# Patient Record
Sex: Female | Born: 2000 | Race: White | Hispanic: No | Marital: Single | Smoking: Never smoker
Health system: Southern US, Community
[De-identification: ages and names within clinical notes are randomized; demographics above are authoritative.]

## PROBLEM LIST (undated history)

## (undated) DIAGNOSIS — H9319 Tinnitus, unspecified ear: Secondary | ICD-10-CM

## (undated) DIAGNOSIS — R569 Unspecified convulsions: Secondary | ICD-10-CM

## (undated) DIAGNOSIS — R531 Weakness: Secondary | ICD-10-CM

## (undated) DIAGNOSIS — R519 Headache, unspecified: Secondary | ICD-10-CM

## (undated) DIAGNOSIS — F429 Obsessive-compulsive disorder, unspecified: Secondary | ICD-10-CM

## (undated) DIAGNOSIS — N390 Urinary tract infection, site not specified: Secondary | ICD-10-CM

## (undated) DIAGNOSIS — R5383 Other fatigue: Secondary | ICD-10-CM

## (undated) DIAGNOSIS — R55 Syncope and collapse: Secondary | ICD-10-CM

## (undated) DIAGNOSIS — R51 Headache: Secondary | ICD-10-CM

## (undated) DIAGNOSIS — L700 Acne vulgaris: Secondary | ICD-10-CM

## (undated) HISTORY — DX: Urinary tract infection, site not specified: N39.0

## (undated) HISTORY — DX: Headache, unspecified: R51.9

## (undated) HISTORY — DX: Acne vulgaris: L70.0

## (undated) HISTORY — DX: Weakness: R53.1

## (undated) HISTORY — PX: WISDOM TOOTH EXTRACTION: SHX21

## (undated) HISTORY — DX: Tinnitus, unspecified ear: H93.19

## (undated) HISTORY — DX: Unspecified convulsions: R56.9

## (undated) HISTORY — DX: Syncope and collapse: R55

## (undated) HISTORY — DX: Headache: R51

## (undated) HISTORY — DX: Other fatigue: R53.83

## (undated) HISTORY — DX: Obsessive-compulsive disorder, unspecified: F42.9

---
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
## 2018-01-04 ENCOUNTER — Encounter: Payer: Self-pay | Admitting: Internal Medicine

## 2018-01-04 ENCOUNTER — Ambulatory Visit: Payer: Commercial Managed Care - PPO | Admitting: Internal Medicine

## 2018-01-04 DIAGNOSIS — G47411 Narcolepsy with cataplexy: Secondary | ICD-10-CM

## 2018-01-04 MED ORDER — AMPHETAMINE-DEXTROAMPHET ER 30 MG PO CP24: 30.0000 mg | ORAL_CAPSULE | Freq: Every day | ORAL | 0 refills | Status: DC

## 2018-01-04 MED ORDER — AMPHETAMINE-DEXTROAMPHETAMINE 10 MG PO TABS: ORAL_TABLET | ORAL | 0 refills | Status: DC

## 2018-01-04 NOTE — Patient Instructions (Signed)
We have increased the Adderall XR to 30 mg, once daily  We have given Adderall 10 mg to take one or two daily as extra support, if needed.  Please call if I can help  Good luck with that college letter!

## 2018-01-04 NOTE — Progress Notes (Signed)
HPI female never smoker followed for Narcolepsy with cataplexy NPSG 10/18/17-AHI 0/hour, desaturation to 96%, REM latency 48 minutes, 26.49% REM, total sleep time 443.5 minutes.  Body weight 140 pounds. MSLT 10/19/17-mean sleep latency 2: 19 minutes, SOREMS 2 / 5 naps Consistent with Narcolepsy  -------------------------------------------------------------------------------- [birth year]/30/17-17 year old female never smoker for sleep evaluation. NPSG 10/18/17-AHI 0/hour, desaturation to 96%, REM latency 48 minutes, 26.49% REM, total sleep time 443.5 minutes.  Body weight 140 pounds. MSLT 10/19/17-mean sleep latency 2: 19 minutes, SOREMS 2 / 5 naps Consistent with Narcolepsy -------sleep consult, referred by Angela BurkeLynn Clett, PA- always tired, falls asleep easily, fast, and during inappropriate times Here with mother, who has watched sleep. Onset of oppressive sleepiness began after mono age 17. No others similar in family. Sleeps soundly, but remains sleepy through the day. Naps necessary but not much help. Coffee has little effect. Vivid dreams. Sleep paralysis 2-3 times per month, lasting 1-2 minutes. Cataplexy occasionally- weak knees with laughter. No snoring or parasomnias otherwise. Vivid dreams even with cat-naps.  Now a HS senior, headed to college. Good over-all health. No suggestion of head trauma, seizure, systemic illness. Epworth 6021  12/2469/3319-17 year old female never smoker followed for Narcolepsy with cataplexy Narcolepsy with cataplexy: Pt is wanting to know if she needs to increase dose of Adderrall as she feels that her body is getting use to it. Headaches come back mid-afternoon with tiredness.  We started Adderall XR 25 mg once daily as of 11/30/17 visit. Adderall XR 25 mg is not working as well-possibly developing some tolerance.  Still some daytime sleepiness.  She is being careful with driving, schoolwork and sleep habits, napping when she can.   ROS-see HPI   + = positive Constitutional:     weight loss, night sweats, fevers, chills, +fatigue, lassitude. HEENT:    headaches, difficulty swallowing, tooth/dental problems, sore throat,       sneezing, itching, ear ache, nasal congestion, post nasal drip, snoring CV:    chest pain, orthopnea, PND, swelling in lower extremities, anasarca,                                                      dizziness, palpitations Resp:   shortness of breath with exertion or at rest.                productive cough,   non-productive cough, coughing up of blood.              change in color of mucus.  wheezing.   Skin:    rash or lesions. GI:  No-   heartburn, indigestion, abdominal pain, nausea, vomiting, diarrhea,                 change in bowel habits, loss of appetite GU: dysuria, change in color of urine, no urgency or frequency.   flank pain. MS:   joint pain, stiffness, decreased range of motion, back pain. Neuro-     nothing unusual Psych:  change in mood or affect.  depression or anxiety.   memory loss.  OBJ- Physical Exam General- Alert, Oriented, Affect-appropriate, Distress- none acute, fully awake at this visit Skin- rash-none, lesions- none, excoriation- none Lymphadenopathy- none Head- atraumatic            Eyes- Gross vision intact, PERRLA, conjunctivae and secretions clear  Ears- Hearing, canals-normal            Nose- Clear, no-Septal dev, mucus, polyps, erosion, perforation             Throat- Mallampati II , mucosa clear , drainage- none, tonsils- atrophic Neck- flexible , trachea midline, no stridor , thyroid nl, carotid no bruit Chest - symmetrical excursion , unlabored           Heart/CV- RRR , no murmur , no gallop  , no rub, nl s1 s2                           - JVD- none , edema- none, stasis changes- none, varices- none           Lung- clear to P&A, wheeze- none, cough- none , dullness-none, rub- none           Chest wall-  Abd-  Br/ Gen/ Rectal- Not done, not indicated Extrem- cyanosis- none, clubbing,  none, atrophy- none, strength- nl Neuro- grossly intact to observation

## 2018-01-26 ENCOUNTER — Telehealth: Payer: Self-pay | Admitting: Pediatrics

## 2018-01-26 ENCOUNTER — Ambulatory Visit: Payer: Commercial Managed Care - PPO | Admitting: Pediatrics

## 2018-01-26 ENCOUNTER — Encounter: Payer: Self-pay | Admitting: Pediatrics

## 2018-01-26 VITALS — Wt 135.9 lb

## 2018-01-26 DIAGNOSIS — R1031 Right lower quadrant pain: Secondary | ICD-10-CM | POA: Diagnosis not present

## 2018-01-26 LAB — CBC WITH DIFFERENTIAL/PLATELET
Basophils Absolute: 33 cells/uL (ref 0–200)
Basophils Relative: 0.5 %
EOS PCT: 0.6 %
Eosinophils Absolute: 39 cells/uL (ref 15–500)
HEMATOCRIT: 37.5 % (ref 34.0–46.0)
HEMOGLOBIN: 12.9 g/dL (ref 11.5–15.3)
LYMPHS ABS: 2308 {cells}/uL (ref 1200–5200)
MCH: 30.4 pg (ref 25.0–35.0)
MCHC: 34.4 g/dL (ref 31.0–36.0)
MCV: 88.2 fL (ref 78.0–98.0)
MONOS PCT: 5.4 %
MPV: 10 fL (ref 7.5–12.5)
NEUTROS ABS: 3770 {cells}/uL (ref 1800–8000)
Neutrophils Relative %: 58 %
Platelets: 279 10*3/uL (ref 140–400)
RBC: 4.25 10*6/uL (ref 3.80–5.10)
RDW: 12.2 % (ref 11.0–15.0)
Total Lymphocyte: 35.5 %
WBC mixed population: 351 cells/uL (ref 200–900)
WBC: 6.5 10*3/uL (ref 4.5–13.0)

## 2018-01-26 LAB — COMPLETE METABOLIC PANEL WITH GFR
AG Ratio: 1.9 (calc) (ref 1.0–2.5)
ALBUMIN MSPROF: 4.7 g/dL (ref 3.6–5.1)
ALT: 12 U/L (ref 5–32)
AST: 14 U/L (ref 12–32)
Alkaline phosphatase (APISO): 65 U/L (ref 47–176)
BUN: 16 mg/dL (ref 7–20)
CALCIUM: 9.8 mg/dL (ref 8.9–10.4)
CO2: 26 mmol/L (ref 20–32)
CREATININE: 0.97 mg/dL (ref 0.50–1.00)
Chloride: 105 mmol/L (ref 98–110)
GFR, EST NON AFRICAN AMERICAN: 85 mL/min/{1.73_m2} (ref >=60)
GFR, Est African American: 99 mL/min/{1.73_m2} (ref >=60)
GLUCOSE: 81 mg/dL (ref 65–99)
Globulin: 2.5 g/dL (calc) (ref 2.0–3.8)
Potassium: 4.3 mmol/L (ref 3.8–5.1)
Sodium: 139 mmol/L (ref 135–146)
Total Bilirubin: 0.7 mg/dL (ref 0.2–1.1)
Total Protein: 7.2 g/dL (ref 6.3–8.2)

## 2018-01-26 LAB — C-REACTIVE PROTEIN: CRP: 0.2 mg/L (ref <8.0)

## 2018-01-26 NOTE — Telephone Encounter (Signed)
Discussed lab and ultra sound results with mom. Lab work results were normal. Ultrasound showed no ovary or bowel abnormality. Instructed mom to have Savannah Hanna return to office for follow up either tomorrow morning or Thursday afternoon. Mom verbalized understanding and agreement.

## 2018-01-26 NOTE — Progress Notes (Signed)
Subjective:     Savannah Hanna is a 17 y.o. female who presents for evaluation of abdominal pain. Approximately 1 week ago, Malavika developed pressure in her right lower abdominal area that self resolved. Last night, the pain returned and was worse. She describes the pain as being stabbing and dull. She felt "puffy" and bloated in that area as well. She became nauseated last night and has had no relief since then. She states that this morning, she couldn't bend over without pain. She denies vomiting, diarrhea, constipation, fevers.   The patient's history has been marked as reviewed and updated as appropriate.  Review of Systems Pertinent items are noted in HPI.     Objective:    Wt 135 lb 14.4 oz (61.6 kg)   BMI 24.07 kg/m  General appearance: alert, cooperative, appears stated age and no distress Head: Normocephalic, without obvious abnormality, atraumatic Eyes: conjunctivae/corneas clear. PERRL, EOM's intact. Fundi benign. Ears: normal TM's and external ear canals both ears Nose: Nares normal. Septum midline. Mucosa normal. No drainage or sinus tenderness. Throat: lips, mucosa, and tongue normal; teeth and gums normal Neck: no adenopathy, no carotid bruit, no JVD, supple, symmetrical, trachea midline and thyroid not enlarged, symmetric, no tenderness/mass/nodules Lungs: clear to auscultation bilaterally Heart: regular rate and rhythm, S1, S2 normal, no murmur, click, rub or gallop Abdomen: normal findings: bowel sounds normal, symmetric and soft and abnormal findings:  tender in RLQ without rebound tenderness, heel strike test negative    Assessment:   Right lower quadrant abdominal pain   Plan:    The diagnosis was discussed with the patient and evaluation and treatment plans outlined. See orders for lab and imaging studies. Reassured patient that symptoms are almost certainly benign and self-resolving. Further follow-up plans will be based on outcome of lab/imaging  studies; see orders. Follow up in 2 days or as needed.   CBC WNL, CRP, not elevated RLQ US normal

## 2018-01-27 ENCOUNTER — Telehealth: Payer: Self-pay | Admitting: Pediatrics

## 2018-01-27 NOTE — Telephone Encounter (Signed)
Per mom, Savannah Hanna pain has improved a little since yesterday. Recommended take a stool softener. Appointment made for tomorrow afternoon for follow up. Mom verbalized understanding and agreement.

## 2018-01-28 ENCOUNTER — Encounter: Payer: Self-pay | Admitting: Pediatrics

## 2018-01-28 ENCOUNTER — Ambulatory Visit: Payer: Commercial Managed Care - PPO | Admitting: Pediatrics

## 2018-01-28 DIAGNOSIS — Z09 Encounter for follow-up examination after completed treatment for conditions other than malignant neoplasm: Secondary | ICD-10-CM | POA: Diagnosis not present

## 2018-01-28 NOTE — Patient Instructions (Signed)
Stool softeners as needed to help with hard stool Follow up as needed

## 2018-01-28 NOTE — Progress Notes (Signed)
Savannah Hanna was seen 2 days ago with right lower quadrant pain. Her US was negative. She reports today that she feels less bloated and the pain has resolved. She does complain of headaches, stating that they occur about the same time of day daily. She recently started stimulant medications for narcolepsy. When she takes her medication, the headaches resolved. No vomiting with headache though she admits to photophobia during the headache.     Review of Systems  Constitutional:  Negative for  appetite change.  HENT:  Negative for nasal and ear discharge.   Eyes: Negative for discharge, redness and itching.  Respiratory:  Negative for cough and wheezing.   Cardiovascular: Negative.  Gastrointestinal: Negative for vomiting and diarrhea.  Musculoskeletal: Negative for arthralgias.  Skin: Negative for rash.  Neurological: Negative       Objective:   Physical Exam  Constitutional: Appears well-developed and well-nourished.   HENT:  Ears: Both TM's normal Nose: No nasal discharge.  Mouth/Throat: Mucous membranes are moist. .  Eyes: Pupils are equal, round, and reactive to light.  Neck: Normal range of motion..  Cardiovascular: Regular rhythm.  No murmur heard. Pulmonary/Chest: Effort normal and breath sounds normal. No wheezes with  no retractions.  Abdominal: Soft. Bowel sounds are normal. No distension and no tenderness.  Musculoskeletal: Normal range of motion.  Neurological: Active and alert.  Skin: Skin is warm and moist. No rash noted.       Assessment:      Follow up RLQ abdominal pain- resolved   Plan:   Follow up with sleep specialists ZO:[id number]:headaches  Follow as needed

## 2018-02-01 ENCOUNTER — Encounter: Payer: Self-pay | Admitting: Pediatrics

## 2018-02-01 ENCOUNTER — Ambulatory Visit: Payer: Commercial Managed Care - PPO | Admitting: Internal Medicine

## 2018-02-01 ENCOUNTER — Encounter: Payer: Self-pay | Admitting: Internal Medicine

## 2018-02-01 DIAGNOSIS — G47411 Narcolepsy with cataplexy: Secondary | ICD-10-CM | POA: Diagnosis not present

## 2018-02-01 MED ORDER — METHYLPHENIDATE HCL ER 20 MG PO TBCR: EXTENDED_RELEASE_TABLET | ORAL | 0 refills | Status: DC

## 2018-02-01 NOTE — Progress Notes (Signed)
[birth year]/30/[birth year]-17 year old female never smoker followed for narcolepsy with cataplexy NPSG 10/18/17-AHI 0/hour, desaturation to 96%, REM latency 48 minutes, 26.49% REM, total sleep time 443.5 minutes.  Body weight 140 pounds. MSLT 10/19/17-mean sleep latency 2: 19 minutes, SOREMS 2 / 5 naps Consistent with Narcolepsy --------------------------------------------------------  [birth year]-17 year old female never smoker followed for Narcolepsy with cataplexy Narcolepsy with cataplexy: Pt is wanting to know if she needs to increase dose of Adderrall as she feels that her body is getting use to it. Headaches come back mid-afternoon with tiredness.  We started Adderall XR 25 mg once daily as of 11/30/17 visit.  [birth year]18-17 year old female never smoker followed for Narcolepsy with cataplexy ----Narcolepsy with cataplexy-pt states Adderall has helped but has noticed no full effect and has headaches. Headaches happen at same time of day every day (3:15pm).  Adderall XR 30 mg twice daily, Adderall 10 mg twice daily if needed Pounding headache with photophobia suggestive of migraine around end of school day.  This began when we increased Adderall to 30 mg Exar.  Additional 10 mg tabs were not much help.  Also noticed some mood irritability at higher doses.  ROS-see HPI   + = positive Constitutional:    weight loss, night sweats, fevers, chills, +fatigue, lassitude. HEENT:    +headaches, difficulty swallowing, tooth/dental problems, sore throat,       sneezing, itching, ear ache, nasal congestion, post nasal drip, snoring CV:    chest pain, orthopnea, PND, swelling in lower extremities, anasarca,                                                      dizziness, palpitations Resp:   shortness of breath with exertion or at rest.                productive cough,   non-productive cough, coughing up of blood.              change in color of mucus.  wheezing.   Skin:    rash or lesions. GI:  No-   heartburn, indigestion,  abdominal pain, nausea, vomiting, diarrhea,                 change in bowel habits, loss of appetite GU: dysuria, change in color of urine, no urgency or frequency.   flank pain. MS:   joint pain, stiffness, decreased range of motion, back pain. Neuro-     nothing unusual Psych:  change in mood or affect.  depression or anxiety.   memory loss.  OBJ- Physical Exam General- Alert, Oriented, Affect-appropriate, Distress- none acute Skin- rash-none, lesions- none, excoriation- none Lymphadenopathy- none Head- atraumatic            Eyes- Gross vision intact, PERRLA, conjunctivae and secretions clear            Ears- Hearing, canals-normal            Nose- Clear, no-Septal dev, mucus, polyps, erosion, perforation             Throat- Mallampati II , mucosa clear , drainage- none, tonsils- atrophic Neck- flexible , trachea midline, no stridor , thyroid nl, carotid no bruit Chest - symmetrical excursion , unlabored           Heart/CV- RRR , no murmur , no gallop  , no rub, nl s1 s2                           -  JVD- none , edema- none, stasis changes- none, varices- none           Lung- clear to P&A, wheeze- none, cough- none , dullness-none, rub- none           Chest wall-  Abd-  Br/ Gen/ Rectal- Not done, not indicated Extrem- cyanosis- none, clubbing, none, atrophy- none, strength- nl Neuro- grossly intact to observation

## 2018-02-01 NOTE — Patient Instructions (Signed)
Change Adderall XR to methylphenidate 20 mg ER    1 twice daily if needed  Please cal as needed

## 2018-02-06 NOTE — Assessment & Plan Note (Signed)
Adderall XR 25 mg not quite enough, but 30 mg XR associated with irritability and headache.  As we seek best options, discussion and education were continued appropriately.  Mother participated. Plan-change to methylphenidate 20 mg ER twice daily if needed

## 2018-02-08 ENCOUNTER — Telehealth: Payer: Self-pay | Admitting: Pediatrics

## 2018-02-08 ENCOUNTER — Ambulatory Visit
Admission: RE | Admit: 2018-02-08 | Discharge: 2018-02-08 | Disposition: A | Discharge status: Home / Self Care | Payer: Commercial Managed Care - PPO | Source: Ambulatory Visit | Attending: Pediatrics | Admitting: Pediatrics

## 2018-02-08 ENCOUNTER — Other Ambulatory Visit: Payer: Self-pay | Admitting: Pediatrics

## 2018-02-08 DIAGNOSIS — R05 Cough: Secondary | ICD-10-CM

## 2018-02-08 DIAGNOSIS — R059 Cough, unspecified: Secondary | ICD-10-CM

## 2018-02-08 NOTE — Telephone Encounter (Signed)
Per our conversation for a chest xray for Savannah Hanna I put the report on your desk thanks

## 2018-02-08 NOTE — Progress Notes (Signed)
[facility] H[facility]. On exam, per the RN, she had diminished lung sounds on the right side. Chest xray ordered.

## 2018-02-08 NOTE — Telephone Encounter (Signed)
[facility] diminished lung sounds on the right. Will call mom with results.

## 2018-02-08 NOTE — Telephone Encounter (Signed)
Chest xray negative for PNA. Discussed results with mom. Mom verbalized understanding.

## 2018-02-23 ENCOUNTER — Encounter: Payer: Self-pay | Admitting: Pediatrics

## 2018-02-23 ENCOUNTER — Ambulatory Visit: Payer: Commercial Managed Care - PPO | Admitting: Pediatrics

## 2018-02-23 VITALS — Temp 98.3°F | Wt 136.5 lb

## 2018-02-23 DIAGNOSIS — R509 Fever, unspecified: Secondary | ICD-10-CM

## 2018-02-23 DIAGNOSIS — J101 Influenza due to other identified influenza virus with other respiratory manifestations: Secondary | ICD-10-CM

## 2018-02-23 LAB — POCT INFLUENZA B: RAPID INFLUENZA B AGN: NEGATIVE

## 2018-02-23 LAB — POCT RAPID STREP A (OFFICE): Rapid Strep A Screen: NEGATIVE

## 2018-02-23 LAB — POCT INFLUENZA A: RAPID INFLUENZA A AGN: POSITIVE

## 2018-02-23 MED ORDER — OSELTAMIVIR PHOSPHATE 75 MG PO CAPS: 75.0000 mg | ORAL_CAPSULE | Freq: Two times a day (BID) | ORAL | 0 refills | Status: AC

## 2018-02-23 NOTE — Assessment & Plan Note (Addendum)
As expected, we are going to continue adjusting stimulant medication.  The end of school year is approaching, with college and away from home issues next year which we have discussed. Plan- Increase maintenance Adderall to 30 XR and allow 10 mg once or twice daily as needed. Consider modafanil.

## 2018-02-23 NOTE — Progress Notes (Signed)
Subjective:     Savannah Hanna is a 17 y.o. female who presents for evaluation of influenza like symptoms. Symptoms include chills, headache, myalgias, productive cough, sinus and nasal congestion, sore throat and fever and have been present for 1 day. She has tried to alleviate the symptoms with acetaminophen and ibuprofen with moderate relief. High risk factors for influenza complications: none.  The following portions of the patient's history were reviewed and updated as appropriate: allergies, current medications, past family history, past medical history, past social history, past surgical history and problem list.  Review of Systems Pertinent items are noted in HPI.     Objective:    Temp 98.3 F (36.8 C)   Wt 136 lb 8 oz (61.9 kg)   BMI 23.43 kg/m  General appearance: alert, cooperative, appears stated age and no distress Head: Normocephalic, without obvious abnormality, atraumatic Eyes: conjunctivae/corneas clear. PERRL, EOM's intact. Fundi benign. Ears: normal TM's and external ear canals both ears Nose: Nares normal. Septum midline. Mucosa normal. No drainage or sinus tenderness., moderate congestion Throat: lips, mucosa, and tongue normal; teeth and gums normal Neck: no adenopathy, no carotid bruit, no JVD, supple, symmetrical, trachea midline and thyroid not enlarged, symmetric, no tenderness/mass/nodules Lungs: clear to auscultation bilaterally Heart: regular rate and rhythm, S1, S2 normal, no murmur, click, rub or gallop    Assessment:    Influenza    Plan:    Supportive care with appropriate antipyretics and fluids. Educational material distributed and questions answered. Antivirals per orders. Follow up in 4 days or as needed.   Discussed potential side effects of Tamiflu Rapid strep negative, throat culture pending. Will call patient if culture results positive. Patient and parent aware.

## 2018-02-23 NOTE — Patient Instructions (Addendum)
1 capsul Tamiflu, two times a day for 5 days Ibuprofen every 6 hours, Tylenol every 4 hours as needed for fevers/pain Encourage plenty of fluids Rest! Rapid strep negative, throat culture pending- no news is good news  Influenza, Pediatric Influenza, more commonly known as "the flu," is a viral infection that primarily affects your child's respiratory tract. The respiratory tract includes organs that help your child breathe, such as the lungs, nose, and throat. The flu causes many common cold symptoms, as well as a high fever and body aches. The flu spreads easily from person to person (is contagious). Having your child get a flu shot (influenza vaccination) every year is the best way to prevent influenza. What are the causes? Influenza is caused by a virus. Your child can catch the virus by:  Breathing in droplets from an infected person's cough or sneeze.  Touching something that was recently contaminated with the virus and then touching his or her mouth, nose, or eyes.  What increases the risk? Your child may be more likely to get the flu if he or she:  Does not clean his or her hands frequently with soap and water or alcohol-based hand sanitizer.  Has close contact with many people during cold and flu season.  Touches his or her mouth, eyes, or nose without washing or sanitizing his or her hands first.  Does not drink enough fluids or does not eat a healthy diet.  Does not get enough sleep or exercise.  Is under a high amount of stress.  Does not get a yearly (annual) flu shot.  Your child may be at a higher risk of complications from the flu, such as a severe lung infection (pneumonia), if he or she:  Has a weakened disease-fighting system (immune system). Your child may have a weakened immune system if he or she: ? Has HIV or AIDS. ? Is undergoing chemotherapy. ? Is taking medicines that reduce the activity of (suppress) the immune system.  Has a long-term (chronic)  illness, such as heart disease, kidney disease, diabetes, or lung disease.  Has a liver disorder.  Has anemia.  What are the signs or symptoms? Symptoms of this condition typically last 4-10 days. Symptoms can vary depending on your child's age, and they may include:  Fever.  Chills.  Headache, body aches, or muscle aches.  Sore throat.  Cough.  Runny or congested nose.  Chest discomfort and cough.  Poor appetite.  Weakness or tiredness (fatigue).  Dizziness.  Nausea or vomiting.  How is this diagnosed? This condition may be diagnosed based on your child's medical history and a physical exam. Your child's health care provider may do a nose or throat swab test to confirm the diagnosis. How is this treated? If influenza is detected early, your child can be treated with antiviral medicine. Antiviral medicine can reduce the length of your child's illness and the severity of his or her symptoms. This medicine may be given by mouth (orally) or through an IV tube that is inserted in one of your child's veins. The goal of treatment is to relieve your child's symptoms by taking care of your child at home. This may include having your child take over-the-counter medicines and drink plenty of fluids. Adding humidity to the air in your home may also help to relieve your child's symptoms. In some cases, influenza goes away on its own. Severe influenza or complications from influenza may be treated in a hospital. Follow these instructions at home: Medicines  Give your child over-the-counter and prescription medicines only as told by your child's health care provider.  Do not give your child aspirin because of the association with Reye syndrome. General instructions   Use a cool mist humidifier to add humidity to the air in your child's room. This can make it easier for your child to breathe.  Have your child: ? Rest as needed. ? Drink enough fluid to keep his or her urine clear or  pale yellow. ? Cover his or her mouth and nose when coughing or sneezing. ? Wash his or her hands with soap and water often, especially after coughing or sneezing. If soap and water are not available, have your child use hand sanitizer. You should wash or sanitize your hands often as well.  Keep your child home from work, school, or daycare as told by your child's health care provider. Unless your child is visiting a health care provider, it is best to keep your child home until his or her fever has been gone for 24 hours after without the use of medicine.  Clear mucus from your young child's nose, if needed, by gentle suction with a bulb syringe.  Keep all follow-up visits as told by your child's health care provider. This is important. How is this prevented?  Having your child get an annual flu shot is the best way to prevent your child from getting the flu. ? An annual flu shot is recommended for every child who is 6 months or older. Different shots are available for different age groups. ? Your child may get the flu shot in late summer, fall, or winter. If your child needs two doses of the vaccine, it is best to get the first shot done as early as possible. Ask your child's health care provider when your child should get the flu shot.  Have your child wash his or her hands often or use hand sanitizer often if soap and water are not available.  Have your child avoid contact with people who are sick during cold and flu season.  Make sure your child is eating a healthy diet, getting plenty of rest, drinking plenty of fluids, and exercising regularly. Contact a health care provider if:  Your child develops new symptoms.  Your child has: ? Ear pain. In young children and babies, this may cause crying and waking at night. ? Chest pain. ? Diarrhea. ? A fever.  Your child's cough gets worse.  Your child produces more mucus.  Your child feels nauseous.  Your child vomits. Get help  right away if:  Your child develops difficulty breathing or starts breathing quickly.  Your child's skin or nails turn blue or purple.  Your child is not drinking enough fluids.  Your child will not wake up or interact with you.  Your child develops a sudden headache.  Your child cannot stop vomiting.  Your child has severe pain or stiffness in his or her neck.  Your child who is younger than 3 months has a temperature of 100F (38C) or higher. This information is not intended to replace advice given to you by your health care provider. Make sure you discuss any questions you have with your health care provider. Document Released: 12/01/2005 Document Revised: 05/08/2016 Document Reviewed: 09/25/2015 Elsevier Interactive Patient Education  2017 [employer].

## 2018-02-25 LAB — CULTURE, GROUP A STREP
MICRO NUMBER:: 90313197
SPECIMEN QUALITY:: ADEQUATE

## 2018-03-01 ENCOUNTER — Ambulatory Visit: Payer: Commercial Managed Care - PPO | Admitting: Internal Medicine

## 2018-03-01 ENCOUNTER — Encounter: Payer: Self-pay | Admitting: Internal Medicine

## 2018-03-01 DIAGNOSIS — G47411 Narcolepsy with cataplexy: Secondary | ICD-10-CM

## 2018-03-01 MED ORDER — ARMODAFINIL 150 MG PO TABS: 150.0000 mg | ORAL_TABLET | Freq: Every day | ORAL | 5 refills | Status: DC

## 2018-03-01 NOTE — Progress Notes (Signed)
[birth year]17 year old female never smoker followed for narcolepsy with cataplexy NPSG 10/18/17-AHI 0/hour, desaturation to 96%, REM latency 48 minutes, 26.49% REM, total sleep time 443.5 minutes.  Body weight 140 pounds. MSLT 10/19/17-mean sleep latency 2: 19 minutes, SOREMS 2 / 5 naps Consistent with Narcolepsy --------------------------------------------------------  02/01/18-17 year old female never smoker followed for Narcolepsy with cataplexy ----Narcolepsy with cataplexy-pt states Adderall has helped but has noticed no full effect and has headaches. Headaches happen at same time of day every day (3:15pm).  Adderall XR 30 mg twice daily, Adderall 10 mg twice daily if needed Pounding headache with photophobia suggestive of migraine around end of school day.  This began when we increased Adderall to 30 mg XR.  Additional 10 mg tabs were not much help.  Also noticed some mood irritability at higher doses.  03/01/18- 17 year old female never smoker followed for Narcolepsy with cataplexy We had started with Adderall XR 25 mg, gone up to 30 mgXR with 10 mg supplements, then backed off because of headache and photophobia to try methylphenidate 20 mg ER twice daily if needed. Here with mother.  Doing pretty would well now with methylphenidate 20 mg ER once or twice daily.  Ask occasional short naps as needed.  School seems to be doing okay and she is in competitive dances. We discussed possible autoimmune connection with narcolepsy-family history of Graves' disease, scleroderma, fibromyalgia. Discussed a trial of armodafinil for comparison.  Exams are coming up and she will start school again in mid August. Her onset of symptoms was after mono in eighth grade and has had more frequent ENT infections since then.  ROS-see HPI   + = positive Constitutional:    weight loss, night sweats, fevers, chills, +fatigue, lassitude. HEENT:    +headaches, difficulty swallowing, tooth/dental problems, sore throat,    sneezing, itching, ear ache, nasal congestion, post nasal drip, snoring CV:    chest pain, orthopnea, PND, swelling in lower extremities, anasarca,                                                      dizziness, palpitations Resp:   shortness of breath with exertion or at rest.                productive cough,   non-productive cough, coughing up of blood.              change in color of mucus.  wheezing.   Skin:    rash or lesions. GI:  No-   heartburn, indigestion, abdominal pain, nausea, vomiting, diarrhea,                 change in bowel habits, loss of appetite GU: dysuria, change in color of urine, no urgency or frequency.   flank pain. MS:   joint pain, stiffness, decreased range of motion, back pain. Neuro-     nothing unusual Psych:  change in mood or affect.  depression or anxiety.   memory loss.  OBJ- Physical Exam General- Alert, Oriented, Affect-appropriate, Distress- none acute Skin- rash-none, lesions- none, excoriation- none Lymphadenopathy- none Head- atraumatic            Eyes- Gross vision intact, PERRLA, conjunctivae and secretions clear            Ears- Hearing, canals-normal  Nose- Clear, no-Septal dev, mucus, polyps, erosion, perforation             Throat- Mallampati II , mucosa clear , drainage- none, tonsils- atrophic Neck- flexible , trachea midline, no stridor , thyroid nl, carotid no bruit Chest - symmetrical excursion , unlabored           Heart/CV- RRR , no murmur , no gallop  , no rub, nl s1 s2                           - JVD- none , edema- none, stasis changes- none, varices- none           Lung- clear to P&A, wheeze- none, cough- none , dullness-none, rub- none           Chest wall-  Abd-  Br/ Gen/ Rectal- Not done, not indicated Extrem- cyanosis- none, clubbing, none, atrophy- none, strength- nl Neuro- grossly intact to observation

## 2018-03-01 NOTE — Patient Instructions (Signed)
Script printed to try armodafinil (Nuvigil) once daily in the morning instead of methylphenidate.  Please call as needed

## 2018-03-02 NOTE — Assessment & Plan Note (Signed)
She will continue methylphenidate as discussed but can try a prescription for armodafinil as an alternative for comparison.

## 2018-03-03 ENCOUNTER — Telehealth: Payer: Self-pay

## 2018-03-03 NOTE — Telephone Encounter (Signed)
PA for Armodafinil 150mg  has been started via CMM.  KEY: [id number]  Will route to Katie to f/u on.

## 2018-03-04 NOTE — Telephone Encounter (Signed)
Checked CMM for update  Approved on March 20  Request Reference Number: [id number]. ARMODAFINIL TAB 150MG  is approved through 03/04/2019. For further questions, call [phone].  DrugArmodafinil 150MG  OR TABS  FormOptumRx Electronic Prior Authorization Form  Original Claim Info75 PA required, call OptumRxat [phone] Requires Prior Authorization  Called Pharmacy and they are aware of approval  Spoke with  Patient Mother about approval, she stated that she was in class at this time and would let her know

## 2018-03-04 NOTE — Telephone Encounter (Signed)
[employer] [facility] has approved Armodafinil 150mg  Tablet Rx from 03-03-18 through 03-03-18. I have contact [facility] and gave approval information to them-was told they refilled Rx and patient has picked up medication so patient is aware of approval as well.  PA# [id number]

## 2018-04-15 ENCOUNTER — Telehealth: Payer: Self-pay | Admitting: Internal Medicine

## 2018-04-15 MED ORDER — METHYLPHENIDATE HCL ER 20 MG PO TBCR: EXTENDED_RELEASE_TABLET | ORAL | 0 refills | Status: DC

## 2018-04-15 NOTE — Telephone Encounter (Signed)
RX has been printed and placed on CY's cart.   Will call mother before placing RX in the mail.

## 2018-04-15 NOTE — Telephone Encounter (Signed)
Spoke with pt's mother Savannah Hanna (dpr on file), requesting refill on metadate .  Pt's mother verified that she had tried Nuvigil  as prescribed at last OV, but pt states that Metadate works better for her.   Last refill: 02/01/18 #60 with 0 refills, 1 tab PO BID prn( (last Nuvigil refill: 03/01/18 #30 with 5 refills- 1 po qd)  CY please advise if ok to prescribe Metadate.  Pt's mother would like rx sent to verified home address on file.  Thanks!

## 2018-04-15 NOTE — Telephone Encounter (Signed)
Ok to remove [employer] as before

## 2018-06-28 ENCOUNTER — Encounter: Payer: Self-pay | Admitting: Internal Medicine

## 2018-06-28 ENCOUNTER — Ambulatory Visit: Payer: Commercial Managed Care - PPO | Admitting: Internal Medicine

## 2018-06-28 DIAGNOSIS — G47411 Narcolepsy with cataplexy: Secondary | ICD-10-CM | POA: Diagnosis not present

## 2018-06-28 MED ORDER — METHYLPHENIDATE HCL ER 20 MG PO TBCR: EXTENDED_RELEASE_TABLET | ORAL | 0 refills | Status: DC

## 2018-06-28 NOTE — Assessment & Plan Note (Signed)
She is doing very well with naps and stable use of metadate  She understands responsibility to drive safely and to protect her metadate supply at school.

## 2018-06-28 NOTE — Patient Instructions (Signed)
Script printed for [employer] refill  Please let us know where you will need Escript refill sent.  Please call as needed

## 2018-06-28 NOTE — Progress Notes (Signed)
[birth year]17 year old female never smoker followed for narcolepsy with cataplexy NPSG 10/18/17-AHI 0/hour, desaturation to 96%, REM latency 48 minutes, 26.49% REM, total sleep time 443.5 minutes.  Body weight 140 pounds. MSLT 10/19/17-mean sleep latency 2: 19 minutes, SOREMS 2 / 5 naps Consistent with Narcolepsy -------------------------------------------------------- 03/01/18- 17 year old female never smoker followed for Narcolepsy with cataplexy We had started with Adderall XR 25 mg, gone up to 30 mgXR with 10 mg supplements, then backed off because of headache and photophobia to try methylphenidate 20 mg ER twice daily if needed. Here with mother.  Doing pretty would well now with methylphenidate 20 mg ER once or twice daily.  Ask occasional short naps as needed.  School seems to be doing okay and she is in competitive dances. We discussed possible autoimmune connection with narcolepsy-family history of Graves' disease, scleroderma, fibromyalgia. Discussed a trial of armodafinil for comparison.  Exams are coming up and she will start school again in mid August. Her onset of symptoms was after mono in eighth grade and has had more frequent ENT infections since then.  06/28/2018- 17 year old female never smoker followed for Narcolepsy with cataplexy ----Narcolepsy with cataplexy: Pt states metadate 20 ER is working well for her. Pt is set to attend  [employer] August 2019-will need to discuss getting medication at student Health.  She preferred Metadate over Nuvigil, which caused headaches.  She uses 1- 1.5 tabs daily if needed.  Feels better if she takes it.  Denies palpitation or overstimulation.  Occasional headache not different.  She will be starting at [employer] in mid August.  Student health there apparently can handle E prescription for continuation of monthly prescriptions of Metadate.                  ROS-see HPI   + = positive Constitutional:    weight loss, night sweats, fevers, chills,  +fatigue, lassitude. HEENT:    +headaches, difficulty swallowing, tooth/dental problems, sore throat,       sneezing, itching, ear ache, nasal congestion, post nasal drip, snoring CV:    chest pain, orthopnea, PND, swelling in lower extremities, anasarca,                                                   dizziness, palpitations Resp:   shortness of breath with exertion or at rest.                productive cough,   non-productive cough, coughing up of blood.              change in color of mucus.  wheezing.   Skin:    rash or lesions. GI:  No-   heartburn, indigestion, abdominal pain, nausea, vomiting, diarrhea,                 change in bowel habits, loss of appetite GU: dysuria, change in color of urine, no urgency or frequency.   flank pain. MS:   joint pain, stiffness, decreased range of motion, back pain. Neuro-     nothing unusual Psych:  change in mood or affect.  depression or anxiety.   memory loss.  OBJ- Physical Exam General- Alert, Oriented, Affect-appropriate, Distress- none acute, +calm and alert Skin- rash-none, lesions- none, excoriation- none Lymphadenopathy- none Head- atraumatic  Eyes- Gross vision intact, PERRLA, conjunctivae and secretions clear            Ears- Hearing, canals-normal            Nose- Clear, no-Septal dev, mucus, polyps, erosion, perforation             Throat- Mallampati II , mucosa clear , drainage- none, tonsils- atrophic Neck- flexible , trachea midline, no stridor , thyroid nl, carotid no bruit Chest - symmetrical excursion , unlabored           Heart/CV- RRR , no murmur , no gallop  , no rub, nl s1 s2                           - JVD- none , edema- none, stasis changes- none, varices- none           Lung- clear to P&A, wheeze- none, cough- none , dullness-none, rub- none           Chest wall-  Abd-  Br/ Gen/ Rectal- Not done, not indicated Extrem- cyanosis- none, clubbing, none, atrophy- none, strength- nl Neuro- grossly intact to  observation, no tremor

## 2018-08-30 ENCOUNTER — Telehealth: Payer: Self-pay | Admitting: Internal Medicine

## 2018-08-30 DIAGNOSIS — G47411 Narcolepsy with cataplexy: Secondary | ICD-10-CM

## 2018-08-30 NOTE — Telephone Encounter (Signed)
Pt is requesting a refill on Metadate 20mg  to be mailed to school address; address below verified.  Last refill: 06/28/18 #60 with 0 refills, take 1 tab BID prn  Last OV: 06/28/18 Next OV: 12/02/18  CY please advise on refill request.

## 2018-08-31 MED ORDER — METHYLPHENIDATE HCL ER 20 MG PO TBCR: EXTENDED_RELEASE_TABLET | ORAL | 0 refills | Status: DC

## 2018-08-31 NOTE — Telephone Encounter (Signed)
Pt returning call. Pt contact number [phone]/kob

## 2018-08-31 NOTE — Telephone Encounter (Signed)
Script obtained, placed up front to be mailed out. Attempted to call patient however voicemail is full. Will hold in triage and attempt to call back later.

## 2018-08-31 NOTE — Telephone Encounter (Signed)
Ok to refill as requested 

## 2018-08-31 NOTE — Telephone Encounter (Signed)
Spoke with patient, made aware script has been placed in out going mail box. Voiced understanding nothing further needed at this time.

## 2018-08-31 NOTE — Telephone Encounter (Signed)
Called pt but there was no answer. Unable to leave message due to her mailbox being full. Will try again later.

## 2018-09-06 ENCOUNTER — Telehealth: Payer: Self-pay | Admitting: Internal Medicine

## 2018-09-06 NOTE — Telephone Encounter (Signed)
Called and spoke with patient, she stated that she still has not received the prescription in the mail that was sent out on the 17th of last week. Patient stated that if she has not received this by tomorrow she will call us back and have us send out another prescription.

## 2018-09-07 ENCOUNTER — Telehealth: Payer: Self-pay | Admitting: Internal Medicine

## 2018-09-07 DIAGNOSIS — G47411 Narcolepsy with cataplexy: Secondary | ICD-10-CM

## 2018-09-07 MED ORDER — METHYLPHENIDATE HCL ER 20 MG PO TBCR: EXTENDED_RELEASE_TABLET | ORAL | 0 refills | Status: DC

## 2018-09-07 NOTE — Telephone Encounter (Signed)
Ok to reprint the prescription for mother to pick up today.  Also please work with mother to make sure we have the right address for mailing scripts to patient.

## 2018-09-07 NOTE — Telephone Encounter (Signed)
Called and spoke to pt's mother, Savannah Hanna.  Savannah Hanna stated that pt has not received Rx for Ritalin that was mailed to pt on 08/31/18. Per Savannah Hanna pt has been without Ritalin for one week.  Savannah Hanna is requesting to pick up Rx for Ritalin 20mg  today.   CY please advise if okay to refill. Thanks  Current Outpatient Medications on File Prior to Visit  Medication Sig Dispense Refill  . ACZONE 5 % topical gel     . Ascorbic Acid (VITAMIN C GUMMIE) 120 MG CHEW Chew 2 tablets by mouth daily.    . methylphenidate (METADATE ER) 20 MG ER tablet 1 twice daily if needed 60 tablet 0  . tazarotene (AVAGE) 0.1 % cream Apply topically at bedtime.    . [DISCONTINUED] fluticasone (FLONASE) 50 MCG/ACT nasal spray 2 sprays per nostril daily at bedtime. Use for 2-4 weeks for nasal stuffiness. 16 g 0   No current facility-administered medications on file prior to visit.     No Known Allergies

## 2018-09-07 NOTE — Telephone Encounter (Signed)
Mother states the patient has not gotten her other mail that has been sent to her at college either.

## 2018-09-07 NOTE — Telephone Encounter (Signed)
Spoke to pt's mother and made her aware that Rx is up front for pickup.  Delane stated she is currently working with pt's college regarding address, as pt has not been receiving any mail.  I have suggested that Delane communicate with student health, to see if Rx can be mailed to them monthly. Nothing further needed at this time.

## 2018-11-29 ENCOUNTER — Telehealth: Payer: Self-pay | Admitting: Internal Medicine

## 2018-11-29 NOTE — Telephone Encounter (Signed)
Called and spoke with the patient mom per KW use the RNA spot for tomorrow nothing further needed at this time.

## 2018-11-30 ENCOUNTER — Encounter: Payer: Self-pay | Admitting: Internal Medicine

## 2018-11-30 ENCOUNTER — Ambulatory Visit: Payer: Commercial Managed Care - PPO | Admitting: Internal Medicine

## 2018-11-30 DIAGNOSIS — R519 Headache, unspecified: Secondary | ICD-10-CM

## 2018-11-30 DIAGNOSIS — G47411 Narcolepsy with cataplexy: Secondary | ICD-10-CM

## 2018-11-30 DIAGNOSIS — R51 Headache: Secondary | ICD-10-CM | POA: Diagnosis not present

## 2018-11-30 NOTE — Patient Instructions (Signed)
We can continue with Metadate, and will be changing by law to Escript systems as of January 1. Just let us know when you need refills.  Please let us know if you need our help

## 2018-11-30 NOTE — Assessment & Plan Note (Signed)
She is doing well with Metadate ER 20 mg, once or twice daily.  She needs to remain careful about naps and getting adequate sleep.

## 2018-11-30 NOTE — Assessment & Plan Note (Signed)
Headaches are a longstanding problem and do not seem to be different in frequency or character with Metadate.

## 2018-11-30 NOTE — Progress Notes (Signed)
11/30/17-17 year old female never smoker followed for narcolepsy with cataplexy NPSG 10/18/17-AHI 0/hour, desaturation to 96%, REM latency 48 minutes, 26.49% REM, total sleep time 443.5 minutes.  Body weight 140 pounds. MSLT 10/19/17-mean sleep latency 2: 19 minutes, SOREMS 2 / 5 naps Consistent with Narcolepsy -------------------------------------------------------- 06/28/2018- 17 year old female never smoker followed for Narcolepsy with cataplexy ----Narcolepsy with cataplexy: Pt states metadate 20 ER is working well for her. Pt is set to attend  [employer] August 2019-will need to discuss getting medication at student Health.  She preferred Metadate over Nuvigil, which caused headaches.  She uses 1- 1.5 tabs daily if needed.  Feels better if she takes it.  Denies palpitation or overstimulation.  Occasional headache not different.  She will be starting at [employer] in mid August.  Student health there apparently can handle E prescription for continuation of monthly prescriptions of Metadate.   11/30/2018-    17 year old female never smoker followed for Narcolepsy with cataplexy   -----Follows for: Narcolepsy, pt reports she is doing ok, medications still effective She feels she is working well with Metadate and has learned to use it.  There was one occasion when out for a few days while student health was out of stock and she had to get medication from local pharmacy.  She understands all prescription will be electronic after January 1.  Describes global/occipital pressure headache at times with no clear relationship to medication.  Denies palpitations or problems with mood.  We discussed the availability of alternative treatments such as Wakix, but see no advantage in trying to change now. Otherwise, she is resolving an incidental cold following typical viral pattern.           ROS-see HPI   + = positive Constitutional:    weight loss, night sweats, fevers, chills, +fatigue, lassitude. HEENT:     +headaches, difficulty swallowing, tooth/dental problems, sore throat,       sneezing, itching, ear ache, nasal congestion, post nasal drip, snoring CV:    chest pain, orthopnea, PND, swelling in lower extremities, anasarca,                                                   dizziness, palpitations Resp:   shortness of breath with exertion or at rest.                productive cough,   non-productive cough, coughing up of blood.              change in color of mucus.  wheezing.   Skin:    rash or lesions. GI:  No-   heartburn, indigestion, abdominal pain, nausea, vomiting, diarrhea,                 change in bowel habits, loss of appetite GU: dysuria, change in color of urine, no urgency or frequency.   flank pain. MS:   joint pain, stiffness, decreased range of motion, back pain. Neuro-     nothing unusual Psych:  change in mood or affect.  depression or anxiety.   memory loss.  OBJ- Physical Exam General- Alert, Oriented, Affect-appropriate, Distress- none acute, +calm and alert Skin- rash-none, lesions- none, excoriation- none Lymphadenopathy- none Head- atraumatic            Eyes- Gross vision intact, PERRLA, conjunctivae and secretions clear  Ears- Hearing, canals-normal            Nose- Clear, no-Septal dev, mucus, polyps, erosion, perforation             Throat- Mallampati II , mucosa clear , drainage- none, tonsils- atrophic Neck- flexible , trachea midline, no stridor , thyroid nl, carotid no bruit Chest - symmetrical excursion , unlabored           Heart/CV- RRR , no murmur , no gallop  , no rub, nl s1 s2                           - JVD- none , edema- none, stasis changes- none, varices- none           Lung- clear to P&A, wheeze- none, cough+, dullness-none, rub- none           Chest wall-  Abd-  Br/ Gen/ Rectal- Not done, not indicated Extrem- cyanosis- none, clubbing, none, atrophy- none, strength- nl Neuro- grossly intact to observation, no tremor

## 2018-12-02 ENCOUNTER — Ambulatory Visit: Payer: Commercial Managed Care - PPO | Admitting: Internal Medicine

## 2019-01-20 ENCOUNTER — Telehealth: Payer: Self-pay | Admitting: Internal Medicine

## 2019-01-20 DIAGNOSIS — G47411 Narcolepsy with cataplexy: Secondary | ICD-10-CM

## 2019-01-20 MED ORDER — METHYLPHENIDATE HCL ER 20 MG PO TBCR: EXTENDED_RELEASE_TABLET | ORAL | 0 refills | Status: DC

## 2019-01-20 NOTE — Telephone Encounter (Signed)
Ritalin refill e-sent 

## 2019-01-20 NOTE — Telephone Encounter (Signed)
E-sent

## 2019-01-20 NOTE — Telephone Encounter (Signed)
Called and spoke with pt's wife Delane letting her know that pt's Ritalin was sent to pharmacy. Delane expressed understanding. Nothing further needed.

## 2019-01-20 NOTE — Telephone Encounter (Signed)
Called and spoke with pt's mother Savannah Hanna. Pt is needing a refill of her Ritalin 20mg  tablet.  Dr. Maple Hudson, please advise if it is okay to refill med for pt. [facility] [address]  Thanks!  No Known Allergies;   Current Outpatient Medications:  .  ACZONE 5 % topical gel, , Disp: , Rfl:  .  Ascorbic Acid (VITAMIN C GUMMIE) 120 MG CHEW, Chew 2 tablets by mouth daily., Disp: , Rfl:  .  methylphenidate (METADATE ER) 20 MG ER tablet, 1 twice daily if needed, Disp: 60 tablet, Rfl: 0 .  tazarotene (AVAGE) 0.1 % cream, Apply topically at bedtime., Disp: , Rfl:

## 2019-03-29 IMAGING — CR DG CHEST 2V
2 series · 2 of 2 positions shown · non-contrast
Comparison: December 18, 2008

CLINICAL DATA: Cough and fever

EXAM:
CHEST  2 VIEW

[w chest pa]
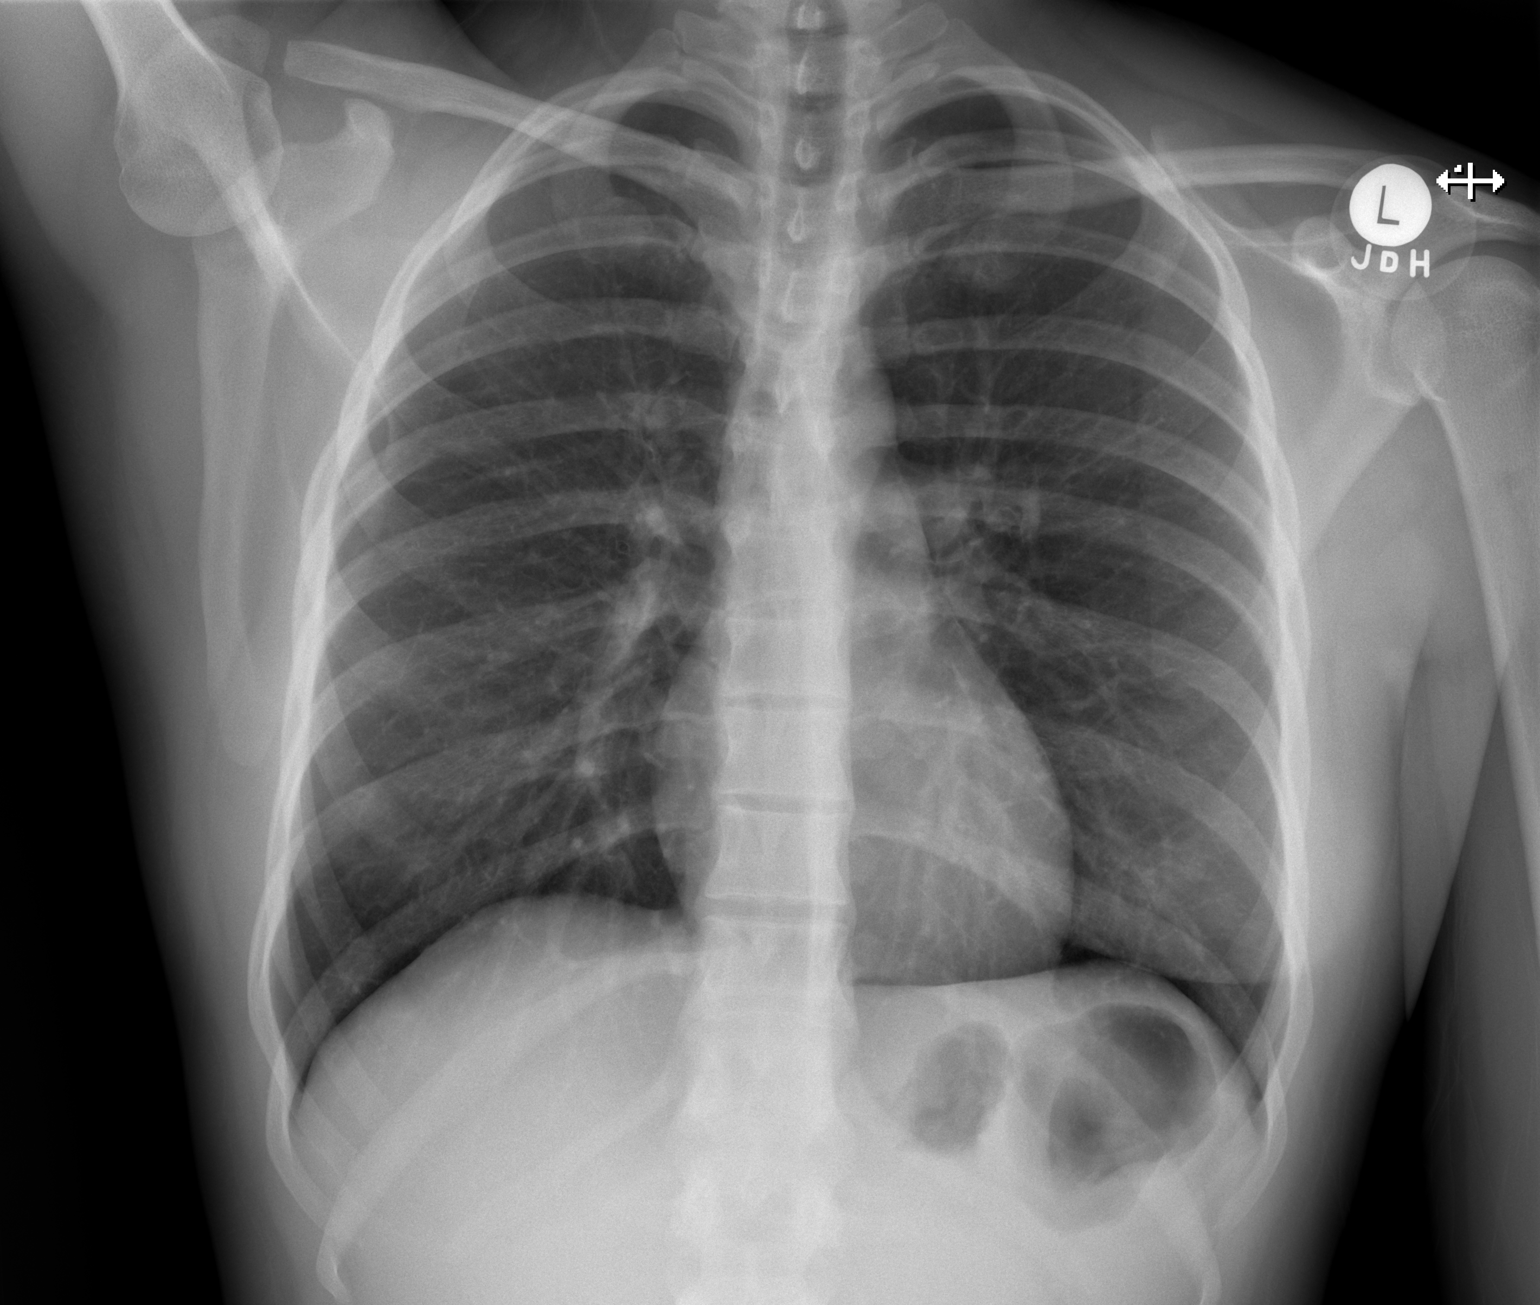

[w chest lat]
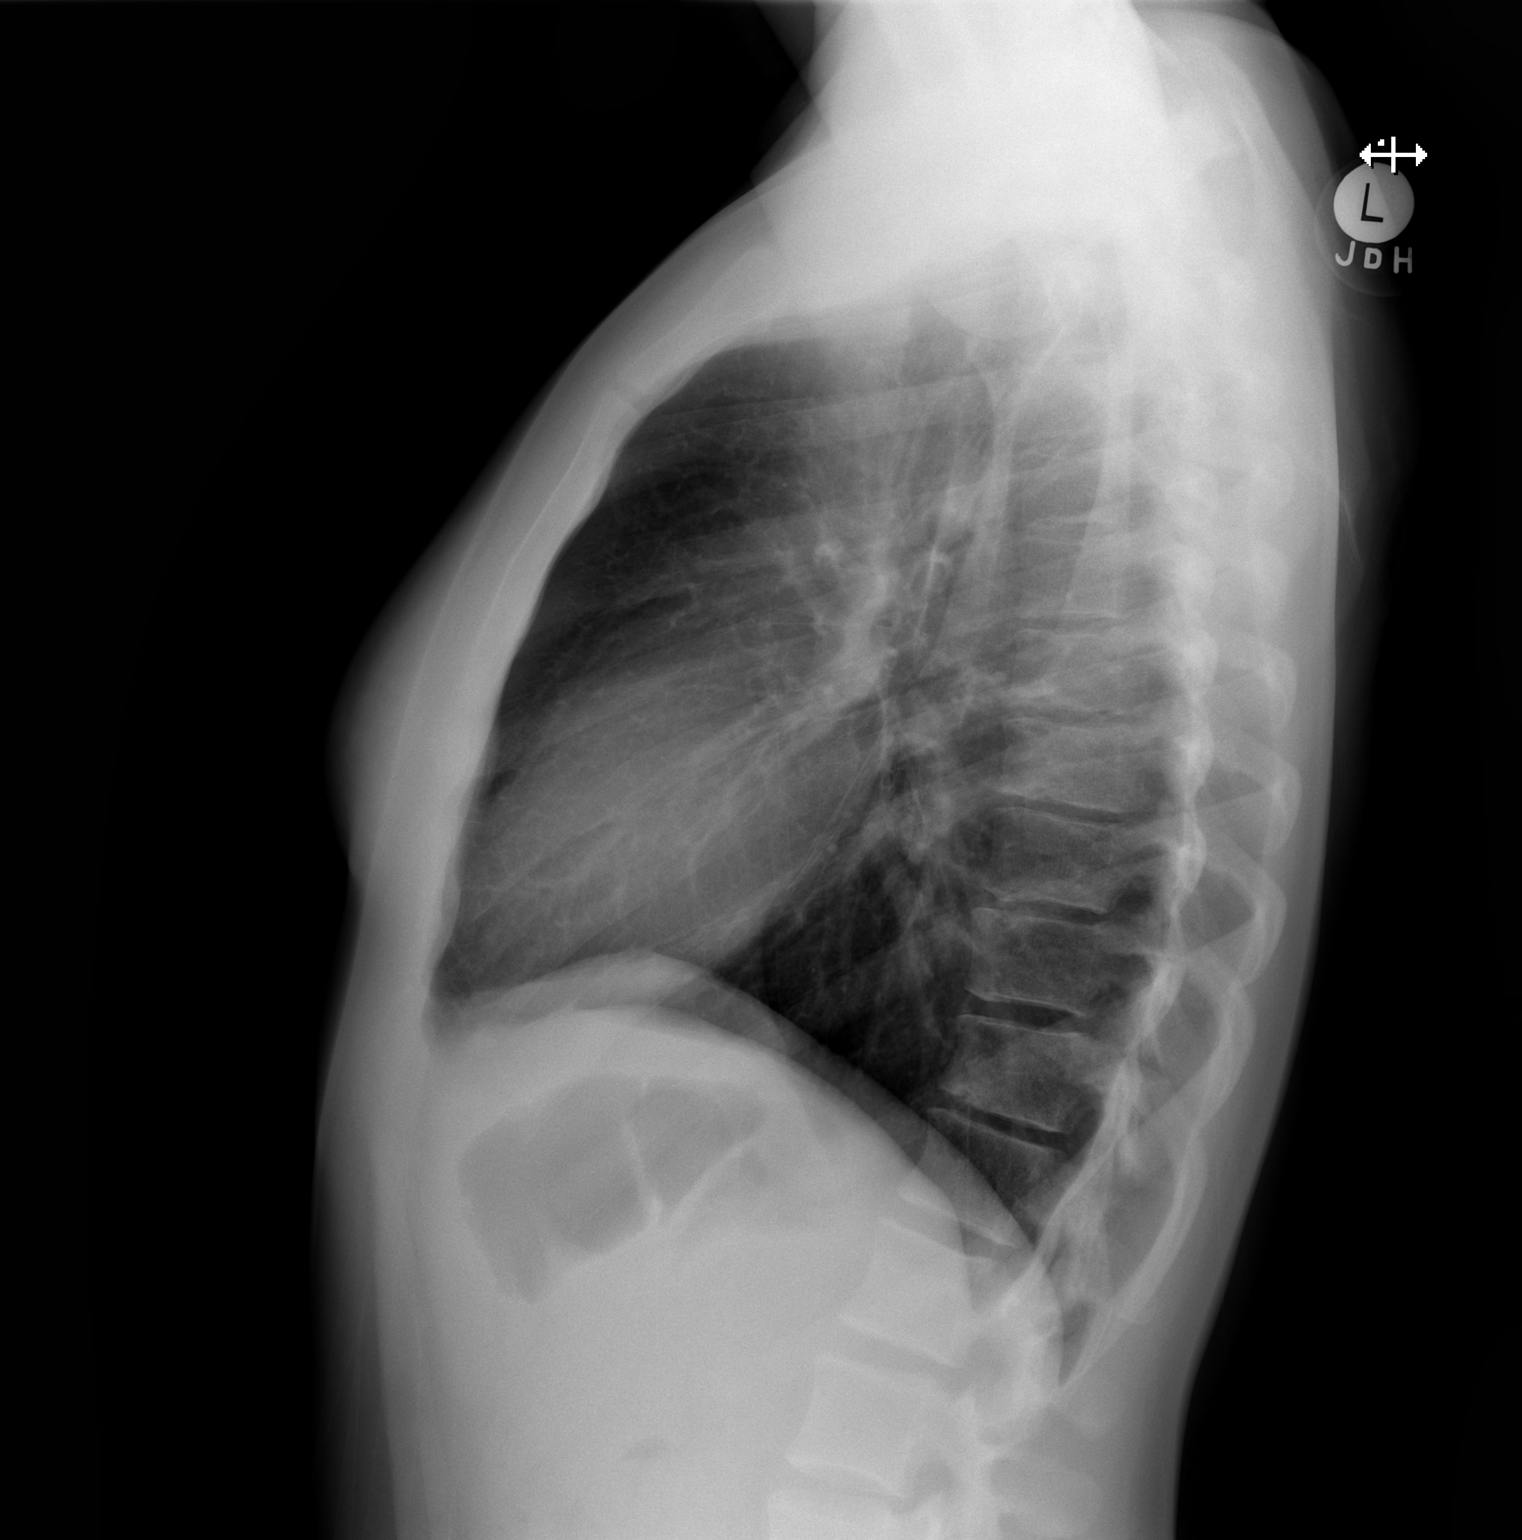

[2 of 2 positions shown; findings below may reference images not displayed]

FINDINGS: Lungs are clear. Heart size and pulmonary vascularity are normal. No
adenopathy. No pneumothorax. No bone lesions.
IMPRESSION: No edema or consolidation.

## 2019-06-02 ENCOUNTER — Ambulatory Visit (INDEPENDENT_AMBULATORY_CARE_PROVIDER_SITE_OTHER): Payer: Commercial Managed Care - PPO | Admitting: Internal Medicine

## 2019-06-02 ENCOUNTER — Encounter: Payer: Self-pay | Admitting: Internal Medicine

## 2019-06-02 ENCOUNTER — Other Ambulatory Visit: Payer: Self-pay

## 2019-06-02 DIAGNOSIS — R519 Headache, unspecified: Secondary | ICD-10-CM

## 2019-06-02 DIAGNOSIS — G47411 Narcolepsy with cataplexy: Secondary | ICD-10-CM | POA: Diagnosis not present

## 2019-06-02 DIAGNOSIS — R51 Headache: Secondary | ICD-10-CM | POA: Diagnosis not present

## 2019-06-02 MED ORDER — METHYLPHENIDATE HCL ER (OSM) 18 MG PO TBCR: EXTENDED_RELEASE_TABLET | ORAL | 0 refills | Status: DC

## 2019-06-02 NOTE — Patient Instructions (Addendum)
Since Metadate was associated with worse headache, we are going to try switching to Concerta (Methylphenidate CR) 18 mg.  We can talk about trying one of a couple of newer meds if you don't like Concerta.  Please call as needed

## 2019-06-02 NOTE — Assessment & Plan Note (Signed)
Noting some tolerance on Metadate, and headaches worse, eased by stopping it when able. Discussed tolerance and alternatives. Plan- change from Metadate to Concerta (her friend likes it). Consider Nuvigil, Wakix (need EKG for long QT), Sunosi     Would need to askif using BCPs.

## 2019-06-02 NOTE — Assessment & Plan Note (Signed)
She noted HA worse on Metadate, better off. No real trend otherwise.

## 2019-06-02 NOTE — Progress Notes (Signed)
[birth year]-18 year old female never smoker followed for narcolepsy with cataplexy NPSG 10/18/17-AHI 0/hour, desaturation to 96%, REM latency 48 minutes, 26.49% REM, total sleep time 443.5 minutes.  Body weight 140 pounds. MSLT 10/19/17-mean sleep latency 2: 19 minutes, SOREMS 2 / 5 naps Consistent with Narcolepsy --------------------------------------------------------  11/30/2018-    18 year old female never smoker followed for Narcolepsy with cataplexy   -----Follows for: Narcolepsy, pt reports she is doing ok, medications still effective She feels she is working well with Metadate and has learned to use it.  There was one occasion when out for a few days while student health was out of stock and she had to get medication from local pharmacy.  She understands all prescription will be electronic after January 1.  Describes global/occipital pressure headache at times with no clear relationship to medication.  Denies palpitations or problems with mood.  We discussed the availability of alternative treatments such as Wakix, but see no advantage in trying to change now. Otherwise, she is resolving an incidental cold following typical viral pattern.           ROS-see HPI   + = positive Constitutional:    weight loss, night sweats, fevers, chills, +fatigue, lassitude. HEENT:    +headaches, difficulty swallowing, tooth/dental problems, sore throat,       sneezing, itching, ear ache, nasal congestion, post nasal drip, snoring CV:    chest pain, orthopnea, PND, swelling in lower extremities, anasarca,                                                   dizziness, palpitations Resp:   shortness of breath with exertion or at rest.                productive cough,   non-productive cough, coughing up of blood.              change in color of mucus.  wheezing.   Skin:    rash or lesions. GI:  No-   heartburn, indigestion, abdominal pain, nausea, vomiting, diarrhea,                 change in bowel habits, loss of  appetite GU: dysuria, change in color of urine, no urgency or frequency.   flank pain. MS:   joint pain, stiffness, decreased range of motion, back pain. Neuro-     nothing unusual Psych:  change in mood or affect.  depression or anxiety.   memory loss.  OBJ- Physical Exam General- Alert, Oriented, Affect-appropriate, Distress- none acute, +calm and alert Skin- rash-none, lesions- none, excoriation- none Lymphadenopathy- none Head- atraumatic            Eyes- Gross vision intact, PERRLA, conjunctivae and secretions clear            Ears- Hearing, canals-normal            Nose- Clear, no-Septal dev, mucus, polyps, erosion, perforation             Throat- Mallampati II , mucosa clear , drainage- none, tonsils- atrophic Neck- flexible , trachea midline, no stridor , thyroid nl, carotid no bruit Chest - symmetrical excursion , unlabored           Heart/CV- RRR , no murmur , no gallop  , no rub, nl s1 s2                           -  JVD- none , edema- none, stasis changes- none, varices- none           Lung- clear to P&A, wheeze- none, cough+, dullness-none, rub- none           Chest wall-  Abd-  Br/ Gen/ Rectal- Not done, not indicated Extrem- cyanosis- none, clubbing, none, atrophy- none, strength- nl Neuro- grossly intact to observation, no tremor  06/02/2019- Virtual Visit via Telephone Note  I connected with Savannah Hanna on 06/02/19 at 11:00 AM EDT by telephone and verified that I am speaking with the correct person using two identifiers.  Location: Patient: H Provider: O   I discussed the limitations, risks, security and privacy concerns of performing an evaluation and management service by telephone and the availability of in person appointments. I also discussed with the patient that there may be a patient responsible charge related to this service. The patient expressed understanding and agreed to proceed.   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 Metadate has made chronic headaches worse. She would like to try Concerta which works well for a friend. Same drug class as discussed. Discussed tolerance. She is depending more on naps since she is at home.   Observations/Objective:   Assessment and Plan: Narcolepsy- ok to try change from Metadate to Concerta. Consider other options later.   Follow Up Instructions: 6 months   I discussed the assessment and treatment plan with the patient. The patient was provided an opportunity to ask questions and all were answered. The patient agreed with the plan and demonstrated an understanding of the instructions.   The patient was advised to call back or seek an in-person evaluation if the symptoms worsen or if the condition fails to improve as anticipated.  I provided 21 minutes of non-face-to-face time during this encounter.   Jetty Duhamellinton Young, MD

## 2019-06-06 ENCOUNTER — Telehealth: Payer: Self-pay | Admitting: Internal Medicine

## 2019-06-06 NOTE — Telephone Encounter (Signed)
Instructions from OV with CY 6/18   Return in about 6 months (around 12/02/2019). Since Metadate was associated with worse headache, we are going to try switching to Concerta (Methylphenidate CR) 18 mg.  We can talk about trying one of a couple of newer meds if you don't like Concerta.  Please call as needed     Called and spoke with pt who stated she went to pharmacy to pick up the Concerta but was told by pharmacy that the med required a PA due to her being on a higher dose of the medication. I stated to pt that once we receive PA request from pharmacy, we would get it taken care of and would notify her once we have the results of the PA. Pt verbalized understanding. Nothing further needed.

## 2019-06-09 ENCOUNTER — Encounter: Payer: Self-pay | Admitting: Internal Medicine

## 2019-06-09 ENCOUNTER — Telehealth: Payer: Self-pay | Admitting: Internal Medicine

## 2019-06-09 MED ORDER — SUNOSI 75 MG PO TABS: 75.0000 mg | ORAL_TABLET | Freq: Every day | ORAL | 5 refills | Status: DC

## 2019-06-09 NOTE — Telephone Encounter (Signed)
The Rx has been pended. Dr. Annamaria Boots, can you please [facility] [address] [facility] Thank you!

## 2019-06-09 NOTE — Telephone Encounter (Signed)
Call returned to patient, made aware of CY recommendations. She states she is open to trying Sunosi.   Savannah Lever, MD to Savannah, Hanna      06/09/19 1:18 PM Miss Savannah Hanna- Your insurance does not want to give two tablets of adderall daily. They will give a single dose of 36 mg daily, but not two 18 mg tabs. That's just how they are. With your headache issues, I think 36 mg would likely be a problem. I'm guessing 18 mg once daily might not be enough. I would like to try a relatively new medicine called Sunosi. This would start at 75 mg each morning. After 3 days, if it is not strong enough, it can be increased to 2 tabs daily. Headache is a potential side effect with this and any of the stimulant/ alerting meds. We won't know until we try.  If you let me know you want to try it, I will then send a script.  Savannah Barre, MD  [facility]  CY please advise of Sunosi dose and frequency. I pended the order for you to review. Thanks.

## 2019-06-09 NOTE — Telephone Encounter (Signed)
Sunosi script e-sent

## 2019-06-09 NOTE — Telephone Encounter (Signed)
Called and spoke with pt letting her know the Rx for Sunosi was sent to pharmacy for her by CY. Pt expressed understanding. Nothing further needed.

## 2019-06-09 NOTE — Telephone Encounter (Signed)
Messaged patient to suggest trying Sunosi for her Narcolepsy

## 2019-06-09 NOTE — Telephone Encounter (Signed)
Sunosi 75 mg, # 50     1 daily each morning x 3 days, then 1 or 2 each morning as needed for alertness. R x 5

## 2019-06-14 ENCOUNTER — Telehealth: Payer: Self-pay | Admitting: Internal Medicine

## 2019-06-14 NOTE — Telephone Encounter (Signed)
Needs a PA for sunosi script.  Will forward to [facility] to follow up.

## 2019-06-15 NOTE — Telephone Encounter (Addendum)
Called & spoke w/ pt. Pt states she called her pharmacy yesterday 06/14/2019 and they stated she will need a PA for [facility]. I let pt know that we will get this taken care of. Pt verbalized understanding.   Called CoverMyMeds to initiate prior authorization for this medication. I spoke with Lana Fish, who stated that Sunosi is approved from 06/15/2019 until 12/16/2019. She will be sending a fax to [phone] with this confirmation.   [facility] [address] [facility] [place]. Tech I spoke to stated that Sunosi was still not approved on their end. I let them know that I would send physical copy of the notice of approval from her insurance company. I verified fax number [phone]. Pharmacy tech stated that this still would not work because a maximum cost needs to be met before they can fill it.   Will keep encounter to f/u on. I will go ahead and fax the approval letter to the pharmacy and plan to call pt's insurance company in the near future.

## 2019-06-16 NOTE — Telephone Encounter (Signed)
Explained to patient that we were able to get the PA approved but the pharmacy still could not fill the medication. They are still receiving an error message of "cost of medication exceeds the maximum". Per Ronalee Belts, the medication is over $1200 and the insurance is refusing to pay. He stated that we may need to call the insurance to get an override.   Called [facility] and spoke with [place]. She stated that the medication had been approved, but the cost of the medication needed a PA as well. As reviewing patient's PA, she stated that the PA only stated that she will need to take the medication once daily. The actual RX states for her to increase to 2 tablets daily after 3 days.   Insurance will pay for it as long as she is taking 1 tablet a day. If she needs more than that, she will need to have 2 additional RXs to equal the 75mg . If CY is ok with her taking 1 tablet daily, then the insurance will approve it.   I looked to see if Sunosi came in a strength lower than 75mg  in our system, I did not see one.   CY, please advise on how you like to proceed. Thank you!

## 2019-06-16 NOTE — Telephone Encounter (Signed)
Pt returning call and would like a call back.

## 2019-06-16 NOTE — Telephone Encounter (Signed)
Have Savannah Hanna try one tablet of Sunosi daily. Explain to her that insurance won't pay for more.

## 2019-06-16 NOTE — Telephone Encounter (Signed)
Attempted to call pt to see if she was given an update in regards to the PA status and info we found out from pharmacy but unable to reach pt and unable to leave VM due to mailbox being full. Will try to call back later.

## 2019-06-20 NOTE — Telephone Encounter (Signed)
Called and spoke with pt letting her know that CY said since insurance will only cover her taking 1 tablet daily of the Sunosi that this is what she should do and pt verbalized understanding. Pt stated med should be arriving soon from [facility]. Nothing further needed.

## 2019-06-28 ENCOUNTER — Other Ambulatory Visit: Payer: Self-pay | Admitting: Internal Medicine

## 2019-06-28 MED ORDER — SUNOSI 75 MG PO TABS: 75.0000 mg | ORAL_TABLET | Freq: Every day | ORAL | 3 refills | Status: DC

## 2019-06-28 NOTE — Progress Notes (Signed)
Sunosi script e-sent to [facility]

## 2019-07-18 ENCOUNTER — Ambulatory Visit: Payer: Commercial Managed Care - PPO | Admitting: Pediatrics

## 2019-07-18 ENCOUNTER — Other Ambulatory Visit: Payer: Self-pay

## 2019-07-18 ENCOUNTER — Encounter: Payer: Self-pay | Admitting: Pediatrics

## 2019-07-18 VITALS — Wt 158.7 lb

## 2019-07-18 DIAGNOSIS — R350 Frequency of micturition: Secondary | ICD-10-CM | POA: Diagnosis not present

## 2019-07-18 DIAGNOSIS — R300 Dysuria: Secondary | ICD-10-CM

## 2019-07-18 LAB — POCT URINALYSIS DIPSTICK
Bilirubin, UA: NEGATIVE
Blood, UA: NEGATIVE
Glucose, UA: NEGATIVE
Ketones, UA: NEGATIVE
Leukocytes, UA: NEGATIVE
Nitrite, UA: NEGATIVE
Protein, UA: NEGATIVE
Spec Grav, UA: <=1.005 — AB (ref 1.010–1.025)
Urobilinogen, UA: NEGATIVE E.U./dL — AB
pH, UA: 5 (ref 5.0–8.0)

## 2019-07-18 NOTE — Progress Notes (Signed)
Subjective:    Savannah Hanna is a 18 y.o. female who complains of frequency and incomplete bladder emptying for several days.  Patient also complains of none. Patient denies back pain, congestion, cough, fever, headache, rhinitis, sorethroat, stomach ache and vaginal discharge.  Patient does not have a history of recurrent UTI.  Patient does not have a history of pyelonephritis.  Mother has a history of urethral constriction.   The following portions of the patient's history were reviewed and updated as appropriate: allergies, current medications, past family history, past medical history, past social history, past surgical history and problem list. Review of Systems Pertinent items are noted in HPI.    Objective:    Wt 158 lb 11.2 oz (72 kg)   BMI 27.24 kg/m  General: alert, cooperative, appears stated age and no distress  Abdomen: soft, non-tender, without masses or organomegaly   Back: straight, CVA tenderness absent  GU: defer exam   Laboratory:  Urine dipstick shows negative for leukocyte esterase and negative for nitrites.  .    Assessment:    Frequent urination    Plan: Plan:    1. Medications: not indicated at this time 2. Maintain adequate hydration 3. Follow up if symptoms not improving, and prn.   4. UCX pending, will call if culture results positive. Patient aware. 5. Patient concerned she has urethral constriction. Recommended she follow up with adult urology in [place], where she attends school.

## 2019-07-18 NOTE — Patient Instructions (Signed)
[facility]-call for appointment to rule out urethral constriction Urine analysis in office negative, urine culture sent to lab- no news is good news Drink plenty of water Follow up as needed

## 2019-07-20 LAB — URINE CULTURE
MICRO NUMBER:: 730250
Result:: NO GROWTH
SPECIMEN QUALITY:: ADEQUATE

## 2019-11-30 ENCOUNTER — Ambulatory Visit: Payer: Commercial Managed Care - PPO | Admitting: Internal Medicine

## 2020-01-04 ENCOUNTER — Ambulatory Visit (INDEPENDENT_AMBULATORY_CARE_PROVIDER_SITE_OTHER): Payer: Commercial Managed Care - PPO | Admitting: Internal Medicine

## 2020-01-04 ENCOUNTER — Encounter: Payer: Self-pay | Admitting: Internal Medicine

## 2020-01-04 ENCOUNTER — Other Ambulatory Visit: Payer: Self-pay

## 2020-01-04 DIAGNOSIS — G47411 Narcolepsy with cataplexy: Secondary | ICD-10-CM | POA: Diagnosis not present

## 2020-01-04 DIAGNOSIS — R519 Headache, unspecified: Secondary | ICD-10-CM | POA: Diagnosis not present

## 2020-01-04 MED ORDER — SUNOSI 75 MG PO TABS: 75.0000 mg | ORAL_TABLET | Freq: Every day | ORAL | 3 refills | Status: DC

## 2020-01-04 MED ORDER — METHYLPHENIDATE HCL ER (OSM) 27 MG PO TBCR: 27.0000 mg | EXTENDED_RELEASE_TABLET | ORAL | 0 refills | Status: DC

## 2020-01-04 NOTE — Patient Instructions (Signed)
Script sent refilling Sunosi  Script sent to try Concerta as an alternative to [facility]  Please call if we can help

## 2020-01-04 NOTE — Assessment & Plan Note (Signed)
Chronic, almost daily headaches, maybe not as severe. We switched her from methylphenidate because of this, but maybe unrelated.  Plan- I suggested she ask her PCP about referral to a headache specialist.

## 2020-01-04 NOTE — Assessment & Plan Note (Signed)
Using Sunosi, about as effective as methylphenidate, but maybe less headache. Not having recognized cataplexy now and managing school on-line during Covid. Reminded to take naps. She wants to try Concerta which helped a friend. Plan- ok to continue Sunosi. Ok to try Concerta as an alternative- PA if [place].

## 2020-01-04 NOTE — Progress Notes (Signed)
[birth year]-19 year old female never smoker followed for narcolepsy with cataplexy NPSG 10/18/17-AHI 0/hour, desaturation to 96%, REM latency 48 minutes, 26.49% REM, total sleep time 443.5 minutes.  Body weight 140 pounds. 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 Metadate has made chronic headaches worse. She would like to try Concerta which works well for a friend. Same drug class as discussed. Discussed tolerance. She is depending more on naps since she is at home.   Observations/Objective:  Assessment and Plan: Narcolepsy- ok to try change from Metadate to Concerta. Consider other options later.  Follow Up Instructions: 6 months           01/04/20- Virtual Visit via Telephone Note  I connected with Savannah Hanna on 01/04/20 at 11:00 AM EST by telephone and verified that I am speaking with the correct person using two identifiers.  Location: Patient: H Provider: O   I discussed the limitations, risks, security and privacy concerns of performing an evaluation and management service by telephone and the availability of in person appointments. I also discussed with the patient that there may be a patient responsible charge related to this service. The patient expressed understanding and agreed to proceed.   History of Present Illness: 19 year old female never smoker followed for Narcolepsy with cataplexy , complicated by Headache Sunosi 75 mg 1-2 daily Student at home/ [place] Still gets headaches, but less with on-line schooling. Thinks Sunosi works about as well as methylphenidate. Asks again about trying Concerta, which  worked very well for a friend. Insurance wouldn't cover last year, but we would try a PA if needed. No recent cataplexy. Reminded to nap and to drive safely. EDS varies day to day.  Observations/Objective:   Assessment and Plan: Narcolepsy with cataplexy- controlled. Ok to try Concerta for comparison with Sunosi. Headache- daily. Not related to medication. Suggest she ask PCP about referral to headache specialist.   Follow Up Instructions: 6 months   I discussed the assessment and treatment plan with the patient. The patient was provided an opportunity to ask questions and all were answered. The patient agreed with the plan and demonstrated an understanding of the instructions.   The patient was advised to call back or seek an in-person evaluation if the symptoms worsen or if the condition fails to improve as anticipated.  I provided 18 minutes of non-face-to-face time during this encounter.   Jetty Duhamel, MD   ROS-see HPI   + = positive Constitutional:    weight loss, night sweats, fevers, chills, +fatigue, lassitude. HEENT:    +headaches, difficulty swallowing, tooth/dental problems, sore throat,       sneezing, itching, ear ache, nasal congestion, post nasal drip, snoring CV:    chest pain, orthopnea, PND, swelling in lower extremities, anasarca,                                                   dizziness, palpitations Resp:   shortness of breath with exertion or at rest.  productive cough,   non-productive cough, coughing up of blood.              change in color of mucus.  wheezing.   Skin:    rash or lesions. GI:  No-   heartburn, indigestion, abdominal pain, nausea, vomiting, diarrhea,                 change in bowel habits, loss of appetite GU: dysuria, change in color of urine, no urgency or frequency.   flank pain. MS:   joint pain, stiffness, decreased range of motion, back pain. Neuro-     nothing unusual Psych:  change in mood or affect.  depression  or anxiety.   memory loss.  OBJ- Physical Exam General- Alert, Oriented, Affect-appropriate, Distress- none acute, +calm and alert Skin- rash-none, lesions- none, excoriation- none Lymphadenopathy- none Head- atraumatic            Eyes- Gross vision intact, PERRLA, conjunctivae and secretions clear            Ears- Hearing, canals-normal            Nose- Clear, no-Septal dev, mucus, polyps, erosion, perforation             Throat- Mallampati II , mucosa clear , drainage- none, tonsils- atrophic Neck- flexible , trachea midline, no stridor , thyroid nl, carotid no bruit Chest - symmetrical excursion , unlabored           Heart/CV- RRR , no murmur , no gallop  , no rub, nl s1 s2                           - JVD- none , edema- none, stasis changes- none, varices- none           Lung- clear to P&A, wheeze- none, cough+, dullness-none, rub- none           Chest wall-  Abd-  Br/ Gen/ Rectal- Not done, not indicated Extrem- cyanosis- none, clubbing, none, atrophy- none, strength- nl Neuro- grossly intact to observation, no tremor

## 2020-06-28 ENCOUNTER — Encounter: Payer: Self-pay | Admitting: Internal Medicine

## 2020-06-28 ENCOUNTER — Other Ambulatory Visit: Payer: Self-pay

## 2020-06-28 ENCOUNTER — Ambulatory Visit: Payer: Commercial Managed Care - PPO | Admitting: Internal Medicine

## 2020-06-28 DIAGNOSIS — G47411 Narcolepsy with cataplexy: Secondary | ICD-10-CM | POA: Diagnosis not present

## 2020-06-28 MED ORDER — METHYLPHENIDATE HCL ER (OSM) 36 MG PO TBCR: EXTENDED_RELEASE_TABLET | ORAL | 0 refills | Status: AC

## 2020-06-28 NOTE — Progress Notes (Signed)
[birth year]-19 year old female never smoker followed for narcolepsy with cataplexy NPSG 10/18/17-AHI 0/hour, desaturation to 96%, REM latency 48 minutes, 26.49% REM, total sleep time 443.5 minutes.  Body weight 140 pounds. MSLT 10/19/17-mean sleep latency 2:19 minutes, SOREMS 2 / 5 naps Consistent with Narcolepsy --------------------------------------------------------   06/02/2019- Virtual Visit via Telephone Note  History of Present Illness: 19 year old female never smoker followed for Narcolepsy with cataplexy , complicated by Headache Metadate ER 20 mg 1 bid prn Student at home/ [place] Has done fairly well. Metadate has made chronic headaches worse. She would like to try Concerta which works well for a friend. Same drug class as discussed. Discussed tolerance. She is depending more on naps since she is at home.   Observations/Objective:  Assessment and Plan: Narcolepsy- ok to try change from Metadate to Concerta. Consider other options later.  Follow Up Instructions: 6 months           01/04/20- Virtual Visit via Telephone Note  History of Present Illness: 19 year old female never smoker followed for Narcolepsy with cataplexy , complicated by Headache Sunosi 75 mg 1-2 daily Student at home/ [place] Still gets headaches, but less with on-line schooling. Thinks Sunosi works about as well as methylphenidate. Asks again about trying Concerta, which worked very well for a friend. Insurance wouldn't cover last year, but we would try a PA if needed. No recent cataplexy. Reminded to nap and to drive safely. EDS varies day to day.  Observations/Objective:   Assessment and Plan: Narcolepsy with cataplexy- controlled. Ok to try Concerta for comparison with Sunosi. Headache- daily. Not related to medication. Suggest she ask PCP about referral to headache specialist.   Follow Up Instructions: 6 months    06/28/20- 19 year old female never smoker followed for Narcolepsy with cataplexy ,  complicated by Headache Sunosi 75 mg 1-2 daily. At her request she was given Concerta CR 27 mg, which worked well for a friend. Sunosi had worked about as well as ritalin. Student at home/ [employer] [employer] Previously tried armodafinil, adderall, metadate ER. All similar to ritalin. Not clear that headaches are better with one than another. Headaches pre-date all Rx for narcolepsy. .   Trying to walk more to build endurancee.  Has not had Covax- I recommended.  ROS-see HPI   + = positive Constitutional:    weight loss, night sweats, fevers, chills, +fatigue, lassitude. HEENT:    +headaches, difficulty swallowing, tooth/dental problems, sore throat,       sneezing, itching, ear ache, nasal congestion, post nasal drip, snoring CV:    chest pain, orthopnea, PND, swelling in lower extremities, anasarca,                                                   dizziness, palpitations Resp:   shortness of breath with exertion or at rest.                productive cough,   non-productive cough, coughing up of blood.              change in color of mucus.  wheezing.   Skin:    rash or lesions. GI:  No-   heartburn, indigestion, abdominal pain, nausea, vomiting, diarrhea,                 change in bowel habits, loss of appetite GU: dysuria,  change in color of urine, no urgency or frequency.   flank pain. MS:   joint pain, stiffness, decreased range of motion, back pain. Neuro-     nothing unusual Psych:  change in mood or affect.  depression or anxiety.   memory loss.  OBJ- Physical Exam General- Alert, Oriented, Affect-appropriate, Distress- none acute, +calm and alert Skin- rash-none, lesions- none, excoriation- none Lymphadenopathy- none Head- atraumatic            Eyes- Gross vision intact, PERRLA, conjunctivae and secretions clear            Ears- Hearing, canals-normal            Nose- Clear, no-Septal dev, mucus, polyps, erosion, perforation             Throat- Mallampati II , mucosa clear , drainage-  none, tonsils- atrophic Neck- flexible , trachea midline, no stridor , thyroid nl, carotid no bruit Chest - symmetrical excursion , unlabored           Heart/CV- RRR , no murmur , no gallop  , no rub, nl s1 s2                           - JVD- none , edema- none, stasis changes- none, varices- none           Lung- clear to P&A, wheeze- none, cough+, dullness-none, rub- none           Chest wall-  Abd-  Br/ Gen/ Rectal- Not done, not indicated Extrem- cyanosis- none, clubbing, none, atrophy- none, strength- nl Neuro- grossly intact to observation, no tremor

## 2020-06-28 NOTE — Patient Instructions (Signed)
We have increased Concerta to 36 mg for trial. We can continue that, or change back as needed.  Please call if we can help

## 2020-07-13 NOTE — Assessment & Plan Note (Signed)
Cataplexy becoming infrequent. She is managing meds and naps for better control of sleepiness now.  Able to proceed with school work.  Plan- Try Concerta increase to 36 mg for comparison

## 2021-06-27 NOTE — Progress Notes (Signed)
[birth year]-20 year old female never smoker followed for narcolepsy with cataplexy NPSG 10/18/17-AHI 0/hour, desaturation to 96%, REM latency 48 minutes, 26.49% REM, total sleep time 443.5 minutes.  Body weight 140 pounds. MSLT 10/19/17-mean sleep latency 2:19 minutes, SOREMS 2 / 5 naps Consistent with Narcolepsy --------------------------------------------------------   06/28/20- 20 year old female never smoker followed for Narcolepsy with cataplexy , complicated by Headache Sunosi 75 mg 1-2 daily. At her request she was given Concerta CR 27 mg, which worked well for a friend. Sunosi had worked about as well as ritalin. Student at home/ [employer] Previously tried armodafinil, adderall, metadate ER. All similar to ritalin. Not clear that headaches are better with one than another. Headaches pre-date all Rx for narcolepsy. .   Trying to walk more to build endurancee.  Has not had Covax- I recommended.  06/28/21- 20 year old female never smoker followed for Narcolepsy with cataplexy , complicated by Headache Sunosi 75 mg 1-2 daily. At her request she was given Concerta CR 27 mg, which worked well for a friend. Sunosi had worked about as well as ritalin. Student at [employer] - Concerta 36 mg CR, Sunosi 75 mg Covid vax- none ------Doing ok, concerta did not help Body weight today 193 lbs Left script for Sunosi at school and never tried it. Says Concerta ineffective. Not pregnant. Admits anxiety. Asks basic blood work until she establishes new primary. Will graduate from [employer] in December and hoping to get into [employer].  Naps most days. Understands responsibility for awake/ alert driving.      ROS-see HPI   + = positive Constitutional:    weight loss, night sweats, fevers, chills, +fatigue, lassitude. HEENT:    +headaches, difficulty swallowing, tooth/dental problems, sore throat,       sneezing, itching, ear ache, nasal congestion, post nasal drip, snoring CV:    chest pain, orthopnea, PND,  swelling in lower extremities, anasarca,                                                   dizziness, palpitations Resp:   shortness of breath with exertion or at rest.                productive cough,   non-productive cough, coughing up of blood.              change in color of mucus.  wheezing.   Skin:    rash or lesions. GI:  No-   heartburn, indigestion, abdominal pain, nausea, vomiting, diarrhea,                 change in bowel habits, loss of appetite GU: dysuria, change in color of urine, no urgency or frequency.   flank pain. MS:   joint pain, stiffness, decreased range of motion, back pain. Neuro-     nothing unusual Psych:  change in mood or affect.  depression or +anxiety.   memory loss.  OBJ- Physical Exam General- Alert, Oriented, Affect-appropriate, Distress- none acute, +calm and alert, +weight gain Skin- rash-none, lesions- none, excoriation- none Lymphadenopathy- none Head- atraumatic            Eyes- Gross vision intact, PERRLA, conjunctivae and secretions clear            Ears- Hearing, canals-normal            Nose- Clear, no-Septal dev,  mucus, polyps, erosion, perforation             Throat- Mallampati II , mucosa clear , drainage- none, tonsils- atrophic Neck- flexible , trachea midline, no stridor , thyroid nl, carotid no bruit Chest - symmetrical excursion , unlabored           Heart/CV- RRR , no murmur , no gallop  , no rub, nl s1 s2                           - JVD- none , edema- none, stasis changes- none, varices- none           Lung- clear to P&A, wheeze- none, cough+, dullness-none, rub- none           Chest wall-  Abd-  Br/ Gen/ Rectal- Not done, not indicated Extrem- cyanosis- none, clubbing, none, atrophy- none, strength- nl Neuro- grossly intact to observation, no tremor

## 2021-06-28 ENCOUNTER — Ambulatory Visit (INDEPENDENT_AMBULATORY_CARE_PROVIDER_SITE_OTHER): Payer: Managed Care, Other (non HMO) | Admitting: Internal Medicine

## 2021-06-28 ENCOUNTER — Other Ambulatory Visit: Payer: Self-pay

## 2021-06-28 ENCOUNTER — Encounter: Payer: Self-pay | Admitting: Internal Medicine

## 2021-06-28 VITALS — BP 118/74 | HR 74 | Temp 97.3°F | Ht 64.0 in | Wt 193.4 lb

## 2021-06-28 DIAGNOSIS — G47411 Narcolepsy with cataplexy: Secondary | ICD-10-CM | POA: Diagnosis not present

## 2021-06-28 DIAGNOSIS — R519 Headache, unspecified: Secondary | ICD-10-CM

## 2021-06-28 LAB — CBC WITH DIFFERENTIAL/PLATELET
Basophils Absolute: 0 10*3/uL (ref 0.0–0.1)
Basophils Relative: 0.3 % (ref 0.0–3.0)
Eosinophils Absolute: 0.1 10*3/uL (ref 0.0–0.7)
Eosinophils Relative: 0.8 % (ref 0.0–5.0)
HCT: 38.5 % (ref 36.0–46.0)
Hemoglobin: 13.2 g/dL (ref 12.0–15.0)
Lymphocytes Relative: 30.3 % (ref 12.0–46.0)
Lymphs Abs: 2.2 10*3/uL (ref 0.7–4.0)
MCHC: 34.2 g/dL (ref 30.0–36.0)
MCV: 91.4 fl (ref 78.0–100.0)
Monocytes Absolute: 0.4 10*3/uL (ref 0.1–1.0)
Monocytes Relative: 6.2 % (ref 3.0–12.0)
Neutro Abs: 4.5 10*3/uL (ref 1.4–7.7)
Neutrophils Relative %: 62.4 % (ref 43.0–77.0)
Platelets: 257 10*3/uL (ref 150.0–400.0)
RBC: 4.21 Mil/uL (ref 3.87–5.11)
RDW: 12.7 % (ref 11.5–15.5)
WBC: 7.2 10*3/uL (ref 4.0–10.5)

## 2021-06-28 LAB — COMPREHENSIVE METABOLIC PANEL
ALT: 29 U/L (ref 0–35)
AST: 27 U/L (ref 0–37)
Albumin: 4.7 g/dL (ref 3.5–5.2)
Alkaline Phosphatase: 71 U/L (ref 39–117)
BUN: 18 mg/dL (ref 6–23)
CO2: 26 mEq/L (ref 19–32)
Calcium: 9.7 mg/dL (ref 8.4–10.5)
Chloride: 102 mEq/L (ref 96–112)
Creatinine, Ser: 1.11 mg/dL (ref 0.40–1.20)
GFR: 70.9 mL/min (ref >60.00)
Glucose, Bld: 79 mg/dL (ref 70–99)
Potassium: 4 mEq/L (ref 3.5–5.1)
Sodium: 136 mEq/L (ref 135–145)
Total Bilirubin: 0.5 mg/dL (ref 0.2–1.2)
Total Protein: 7.5 g/dL (ref 6.0–8.3)

## 2021-06-28 LAB — TSH: TSH: 1.56 u[IU]/mL (ref 0.35–5.50)

## 2021-06-28 MED ORDER — SUNOSI 75 MG PO TABS: 75.0000 mg | ORAL_TABLET | Freq: Every day | ORAL | 3 refills | Status: DC

## 2021-06-28 NOTE — Assessment & Plan Note (Signed)
Continues to emphasize sleep hygiene, adequate sleep and naps. Plan- Try Sunosi. If not effective we will look at dosing and at alternative alerting meds.

## 2021-06-28 NOTE — Patient Instructions (Addendum)
Script sent to try [facility] as discussed- instead of Concerta  Still good to nap as needed  Please call if we can help   Order- lab- CMET, TSH    dx Narcolepsy

## 2021-06-28 NOTE — Assessment & Plan Note (Signed)
Not worse with current meds.  Watch for changes with stimulant medications.

## 2021-07-04 ENCOUNTER — Telehealth: Payer: Self-pay | Admitting: Internal Medicine

## 2021-07-04 DIAGNOSIS — G47411 Narcolepsy with cataplexy: Secondary | ICD-10-CM

## 2021-07-04 NOTE — Telephone Encounter (Signed)
Spoke with the pt  She states she is unsure if her new insurance, [employer] will cover her Sunosi  She is not aware of which card they have on file  I have called [facility] [address] and they do have her cinga on file  They stated that for some reason she has 2 different profiles She does require PA for sunosi- call [phone] member ID [id number] PA reference number is [id number] and turnaround time for this is 48-72 hours  Will hold in triage for f/u since Dr Roxy Cedar nurse not in rest of wk Pt aware

## 2021-07-10 NOTE — Telephone Encounter (Signed)
Called pt's pharmacy to check to see if pt had been able to pick up Sunosi yet and pharmacy stated it was still showing that the med was needing a PA.  Called pt's insurance to check status of the PA. Spoke with Chantel who stated that the PA was still pending as there were additional questions that needed to be answered and the paperwork had been faxed to our office for [place] to complete.  Paperwork has been obtained and has been filled out and faxed to [employer].  Will keep encounter open to await PA update.

## 2021-07-12 NOTE — Telephone Encounter (Signed)
ATC to check status of PA was on hold for over 10 minutes will try back

## 2021-07-23 NOTE — Telephone Encounter (Signed)
Mandi have you seen a fax determination on this?

## 2021-07-23 NOTE — Telephone Encounter (Signed)
Unfortunately I have not been at the [place] office in a little over 2 weeks so I have not seen anything. Will route to Dr. Maple Hudson to see if he has anything

## 2021-08-09 NOTE — Telephone Encounter (Signed)
Received determination form from Dr. Maple Hudson that states:  Request has been partially approved based on the information we have received from you/your healthcare practitioner. We have approved Sunosi 75 mg tablet. Effective for a maximum of 12 refills from 07/12/21- 07/11/2022 as long as you are enrolled as a member of your current health plan. The request in ONLY APPROVED FOR 1 TABLET PER DAY. The request was for 2 tablets a day but you plan allows for a quantity of 1 tablet per day.   In order for this request to be approved at the quantity requested your provider needs to give [place] information showing that the larger amount is clinically necessary for your care, including a statement that the lower quantity did not or would no work for you AND that you also tried Sunosi 150 mg tablet at a quantity of 1 per day.    Dr. Maple Hudson are you ok with them approving Sunosi 75 mg 1 per day?

## 2021-08-09 NOTE — Telephone Encounter (Signed)
Yes, Sunosi 1/ day. If that is not enough we will need to doing something different

## 2021-08-09 NOTE — Telephone Encounter (Signed)
Dr. Maple Hudson do you recall seeing a form on this patient?

## 2021-08-09 NOTE — Telephone Encounter (Signed)
I have something here from [employer] saying Sunosi was approved from 07/12/21-07/11/22. Do you want to pick it up on C-pod and fax it to her drug store??

## 2021-08-12 NOTE — Telephone Encounter (Signed)
Noted  

## 2021-08-27 ENCOUNTER — Telehealth: Payer: Self-pay | Admitting: Internal Medicine

## 2021-08-27 DIAGNOSIS — G47411 Narcolepsy with cataplexy: Secondary | ICD-10-CM

## 2021-08-27 DIAGNOSIS — R519 Headache, unspecified: Secondary | ICD-10-CM

## 2021-08-27 NOTE — Telephone Encounter (Signed)
Dr. Maple Hudson, please advise if you are okay with referral being placed.

## 2021-08-28 NOTE — Telephone Encounter (Signed)
Absolutely- please refer with records as requested.

## 2021-08-28 NOTE — Telephone Encounter (Signed)
Referral to endocrinology has been placed for pt. Called and spoke with pt letting her know that this had been done and she verbalized understanding. Nothing further needed.

## 2021-10-10 ENCOUNTER — Telehealth: Payer: Self-pay | Admitting: Internal Medicine

## 2021-10-11 NOTE — Telephone Encounter (Signed)
Lm for patient.  

## 2021-10-14 NOTE — Telephone Encounter (Signed)
Called and spoke with patient who is asking for her blood work be sent to her Endocrinologist. Labs have been faxed over. Nothing further needed at this time.

## 2021-10-29 ENCOUNTER — Telehealth: Payer: Self-pay | Admitting: Internal Medicine

## 2021-10-29 NOTE — Telephone Encounter (Signed)
Pt calling to get refill of Sunosi. Pt states she has 3 refills, but when she went to get it filled the manufacturer changed the # or something and so she needs another PA. Pt has 1 pill left and needs asap. Please advise [phone] Pharmacy-[facility] [address] in [place]

## 2021-10-29 NOTE — Telephone Encounter (Signed)
Called [facility] twice and was a long time for both times. Will call back tomorrow.

## 2021-10-31 ENCOUNTER — Telehealth: Payer: Self-pay

## 2021-10-31 ENCOUNTER — Other Ambulatory Visit (HOSPITAL_COMMUNITY): Payer: Self-pay

## 2021-10-31 NOTE — Telephone Encounter (Signed)
Patient Advocate Encounter  Prior Authorization for Sunosi 75mg  tabs has been approved.    PA# Effective dates: 10/31/21 through 10/30/22  Per Test Claim Patients co-pay is $9.00   Spoke with Pharmacy to Process.  Patient Advocate Fax:  [phone]

## 2021-10-31 NOTE — Telephone Encounter (Signed)
Patient Advocate Encounter   Received notification from [facility] that prior authorization for Sunosi 75mg  tabs is due for renewal.   PA submitted on 10/31/21 Key#: [id number]  Status is pending    [facility] will continue to follow:  Patient 11/02/21 Endocrinology Clinic Phone: [phone] Fax:  [phone]

## 2021-11-05 NOTE — Telephone Encounter (Signed)
Called and spoke with pt to see if she had been able to receive her Sunosi and pt said that she had not yet but saw that the PA had been done. Pt stated that she tried to call the pharmacy but when she tried to call them they were not open yet. Pt said she was going to try to see if med had been filled at pharmacy. Nothing further needed.

## 2021-12-31 ENCOUNTER — Telehealth: Payer: Self-pay | Admitting: Internal Medicine

## 2021-12-31 MED ORDER — SUNOSI 75 MG PO TABS: 75.0000 mg | ORAL_TABLET | Freq: Every day | ORAL | 3 refills | Status: DC

## 2021-12-31 NOTE — Telephone Encounter (Signed)
Called and spoke with patient. She stated that she is need of a refill for her Sunosi to [facility] [place] on [address]. She is currently scheduled for a follow up in June 2023.   Dr. Maple Hudson, please advise if you are ok with this refill. Thanks!

## 2021-12-31 NOTE — Telephone Encounter (Signed)
Sunosi refilled 

## 2021-12-31 NOTE — Telephone Encounter (Signed)
Attempted to call pt but unable to reach. Left message for her to return call. 

## 2021-12-31 NOTE — Telephone Encounter (Signed)
Called patient but she did not answer. Left message for her to call back.  

## 2021-12-31 NOTE — Telephone Encounter (Signed)
Spoke with the pt and notified that rx was sent  Nothing further needed 

## 2021-12-31 NOTE — Telephone Encounter (Signed)
Patient returning call.

## 2022-06-25 ENCOUNTER — Telehealth: Payer: Self-pay | Admitting: Internal Medicine

## 2022-06-25 NOTE — Telephone Encounter (Signed)
Called patient and advised her that we could change her appointment with Dr Maple Hudson to a mychart video visit. Patient verbalized understanding of how the visit works. Nothing further needed

## 2022-06-25 NOTE — Telephone Encounter (Signed)
Pt wants to know if she can do with a mychart video visit for her 7/17 appt. She has moved to [facility] and works full time, so she cannot come to office without needing to reschedule. She really likes Dr Maple Hudson and doesnt want to change to elsewhere. States all is okay and that this is just her yearly checkup. Please advise.

## 2022-06-28 NOTE — Progress Notes (Signed)
11/30/17-21 year old female never smoker followed for narcolepsy with cataplexy NPSG 10/18/17-AHI 0/hour, desaturation to 96%, REM latency 48 minutes, 26.49% REM, total sleep time 443.5 minutes.  Body weight 140 pounds. MSLT 10/19/17-mean sleep latency 2:19 minutes, SOREMS 2 / 5 naps Consistent with Narcolepsy --------------------------------------------------------  06/28/21- 21 year old female never smoker followed for Narcolepsy with cataplexy , complicated by Headache Sunosi 75 mg 1-2 daily. At her request she was given Concerta CR 27 mg, which worked well for a friend. Sunosi had worked about as well as ritalin. Student at [employer] - Concerta 36 mg CR, Sunosi 75 mg Covid vax- none ------Doing ok, concerta did not help Body weight today 193 lbs Left script for Sunosi at school and never tried it. Says Concerta ineffective. Not pregnant. Admits anxiety. Asks basic blood work until she establishes new primary. Will graduate from [employer] in December and hoping to get into [employer].  Naps most days. Understands responsibility for awake/ alert driving.      [birth year]- Virtual Visit via Video Note  I connected with Daisey Must on 06/28/22 at 10:30 AM EDT by a video enabled telemedicine application and verified that I am speaking with the correct person using two identifiers.  Location: Patient: Home Provider: Office   I discussed the limitations of evaluation and management by telemedicine and the availability of in person appointments. The patient expressed understanding and agreed to proceed.  History of Present Illness: 21 year old female never smoker followed for Narcolepsy with cataplexy , complicated by Headache Sunosi 75 mg 1-2 daily. At her request she was given trial Concerta CR 27 mg, which worked well for a friend. Sunosi had worked about as well as ritalin. Graduated [employer], applied Dental School, engaged to marry. Saw Endocrine for weight gain, nl TFTs. -   Sunosi 75 mg Covid vax- none Chooses now to stay with Sunosi.  Dealing with cost/coverage issues. Applying to dental school.  We discussed naps, need to discuss medications with OB/GYN if she anticipates pregnancy.   Observations/Objective:   Assessment and Plan:   Follow Up Instructions:    I discussed the assessment and treatment plan with the patient. The patient was provided an opportunity to ask questions and all were answered. The patient agreed with the plan and demonstrated an understanding of the instructions.   The patient was advised to call back or seek an in-person evaluation if the symptoms worsen or if the condition fails to improve as anticipated.  I provided 20 minutes of non-face-to-face time during this encounter.   Jetty Duhamel, MD ............................................................................Marland Kitchen  ROS-see HPI   + = positive Constitutional:    weight loss, night sweats, fevers, chills, +fatigue, lassitude. HEENT:    +headaches, difficulty swallowing, tooth/dental problems, sore throat,       sneezing, itching, ear ache, nasal congestion, post nasal drip, snoring CV:    chest pain, orthopnea, PND, swelling in lower extremities, anasarca,  dizziness, palpitations Resp:   shortness of breath with exertion or at rest.                productive cough,   non-productive cough, coughing up of blood.              change in color of mucus.  wheezing.   Skin:    rash or lesions. GI:  No-   heartburn, indigestion, abdominal pain, nausea, vomiting, diarrhea,                 change in bowel habits,  loss of appetite GU: dysuria, change in color of urine, no urgency or frequency.   flank pain. MS:   joint pain, stiffness, decreased range of motion, back pain. Neuro-     nothing unusual Psych:  change in mood or affect.  depression or +anxiety.   memory loss.  OBJ- Physical Exam General- Alert, Oriented, Affect-appropriate, Distress- none acute, +calm and  alert, +weight gain Skin- rash-none, lesions- none, excoriation- none Lymphadenopathy- none Head- atraumatic            Eyes- Gross vision intact, PERRLA, conjunctivae and secretions clear            Ears- Hearing, canals-normal            Nose- Clear, no-Septal dev, mucus, polyps, erosion, perforation             Throat- Mallampati II , mucosa clear , drainage- none, tonsils- atrophic Neck- flexible , trachea midline, no stridor , thyroid nl, carotid no bruit Chest - symmetrical excursion , unlabored           Heart/CV- RRR , no murmur , no gallop  , no rub, nl s1 s2                           - JVD- none , edema- none, stasis changes- none, varices- none           Lung- clear to P&A, wheeze- none, cough+, dullness-none, rub- none           Chest wall-  Abd-  Br/ Gen/ Rectal- Not done, not indicated Extrem- cyanosis- none, clubbing, none, atrophy- none, strength- nl Neuro- grossly intact to observation, no tremor

## 2022-06-30 ENCOUNTER — Telehealth (INDEPENDENT_AMBULATORY_CARE_PROVIDER_SITE_OTHER): Payer: Managed Care, Other (non HMO) | Admitting: Internal Medicine

## 2022-06-30 DIAGNOSIS — G47411 Narcolepsy with cataplexy: Secondary | ICD-10-CM | POA: Diagnosis not present

## 2022-06-30 MED ORDER — SUNOSI 75 MG PO TABS: 75.0000 mg | ORAL_TABLET | Freq: Every day | ORAL | 3 refills | Status: DC

## 2022-06-30 NOTE — Patient Instructions (Signed)
[facility] [place]

## 2022-07-16 ENCOUNTER — Telehealth: Payer: Self-pay | Admitting: Internal Medicine

## 2022-07-16 NOTE — Telephone Encounter (Signed)
Ladies,  prior auth needed for her prescription for Sunosi/[facility] [place]/please advise  Thank you

## 2022-07-18 ENCOUNTER — Other Ambulatory Visit (HOSPITAL_COMMUNITY): Payer: Self-pay

## 2022-07-18 NOTE — Telephone Encounter (Signed)
Called and left patient a voicemail stating the her medication Sunosi did not require a prior auth and that she can fill it on 07/21/2022. Nothing further needed.

## 2022-07-23 ENCOUNTER — Telehealth: Payer: Self-pay | Admitting: Internal Medicine

## 2022-07-23 NOTE — Telephone Encounter (Signed)
Patient called once again stating that the medication Sunosi #60 for 30 days is required for her to pick up her medication  Please advise

## 2022-07-24 ENCOUNTER — Telehealth: Payer: Self-pay

## 2022-07-24 ENCOUNTER — Other Ambulatory Visit (HOSPITAL_COMMUNITY): Payer: Self-pay

## 2022-07-24 NOTE — Telephone Encounter (Signed)
Called and left patient a voicemail that we did submit her prior auth for her medication. And that we were sorry for all the confusion with her medication. Nothing further needed

## 2022-07-24 NOTE — Telephone Encounter (Signed)
Patient Advocate Encounter   Received notification that prior authorization for Sunosi 75MG  tablets is required.   PA submitted on 07/24/2022 Key : 09/23/2022 Status is pending

## 2022-07-24 NOTE — Telephone Encounter (Signed)
I ran a test claim today and now it states prior auth after previous test claim stated, "refill too soon until 07/21/2022". I submitted prior auth.

## 2022-07-29 NOTE — Telephone Encounter (Signed)
Please see if [name] can update an Epworth score for [place]. Insurance didn't tell me they needed that.

## 2022-07-29 NOTE — Telephone Encounter (Signed)
Sir please note below:  Patient Research scientist (life sciences)   Received a fax from [employer] regarding Prior Authorization for [employer].    Authorization has been DENIED due to criteria not met. Per insurance:   "For treatment of EDS with narcolepsy, our guideline named SOLRIAMFETOL requires that you have sustained improvement in Epworth Sleepiness Scale (ESS) scores by at least 25% compared to baseline. This request has been denied because we did not receive information that you meet the  requirements listed above."   Clista Bernhardt, CPhT Rx Patient Advocate Specialist Phone: [phone]

## 2022-07-29 NOTE — Telephone Encounter (Signed)
Patient Advocate Encounter  Received a fax from [employer] regarding Prior Authorization for [place] H[place].   Authorization has been DENIED due to criteria not met. Per insurance:  "For treatment of EDS with narcolepsy, our guideline named SOLRIAMFETOL requires that you have sustained improvement in Epworth Sleepiness Scale (ESS) scores by at least 25% compared to baseline. This request has been denied because we did not receive information that you meet the  requirements listed above."  Clista Bernhardt, CPhT Rx Patient Advocate Specialist Phone: [phone]

## 2022-07-29 NOTE — Telephone Encounter (Signed)
Patient Advocate Encounter   Received a fax from [employer] regarding Prior Authorization for [place].    Authorization has been DENIED due to criteria not met. Per insurance:   "For treatment of EDS with narcolepsy, our guideline named SOLRIAMFETOL requires that you have sustained improvement in Epworth Sleepiness Scale (ESS) scores by at least 25% compared to baseline. This request has been denied because we did not receive information that you meet the  requirements listed above."   Clista Bernhardt, CPhT Rx Patient Advocate Specialist Phone: [phone]  Hey I have updated the Epworth scale for this patient. Can we try to resubmit the PA for her medication. Or does anything else need to be done.   Thank you

## 2022-08-04 ENCOUNTER — Telehealth: Payer: Self-pay | Admitting: Internal Medicine

## 2022-08-04 ENCOUNTER — Other Ambulatory Visit (HOSPITAL_COMMUNITY): Payer: Self-pay

## 2022-08-04 NOTE — Telephone Encounter (Signed)
Hey so its this patients medication decision with the appeal team right now?  Please advise

## 2022-08-05 ENCOUNTER — Other Ambulatory Visit (HOSPITAL_COMMUNITY): Payer: Self-pay

## 2022-08-05 MED ORDER — METHYLPHENIDATE HCL ER (OSM) 36 MG PO TBCR: 36.0000 mg | EXTENDED_RELEASE_TABLET | Freq: Every day | ORAL | 0 refills | Status: AC

## 2022-08-05 NOTE — Telephone Encounter (Signed)
Good morning sir,   So the pharmacy team is gathering all the documentation for her Sunosi. They will send information ad documentation to [employer] for a decision.  In the mean time do you want to prescribe her something else while we wait,  Please advise sir

## 2022-08-05 NOTE — Telephone Encounter (Signed)
A couple of years ago we were giving her Concerta 36 mg. Would she want that again until we get a decision about Sunosi??

## 2022-08-05 NOTE — Telephone Encounter (Signed)
Called patient back and she states that she is willing to try the Concerta 36mg  again.   [facility] [place] [address]  Please advise sir.

## 2022-08-05 NOTE — Telephone Encounter (Signed)
Savannah Hanna,  There is an updated Epworth scale score in her flow sheets. I did it on 07/20/2022.  Do you need me to do a new one for today?  Thank you

## 2022-08-05 NOTE — Telephone Encounter (Signed)
Script sent for Concerta- hope it helps

## 2022-08-05 NOTE — Telephone Encounter (Signed)
Called patient back and let her know that Dr Maple Hudson did send in the Concerta for her and that we will call her with an update on the [facility]. Nothing further needed

## 2022-08-05 NOTE — Telephone Encounter (Signed)
Under flow sheet you can type in epworth in the little box on the right hand side and then go to database and it should show up. If not I can just type the whole thing out for you if you cant see it.   Thank you

## 2022-08-07 NOTE — Telephone Encounter (Signed)
Received letter from [employer] that they received the Appeal request. It states they will send the determination by letter no later than 72 hours from receipt of appeal request. Saved in Media.

## 2022-08-13 ENCOUNTER — Other Ambulatory Visit (HOSPITAL_COMMUNITY): Payer: Self-pay

## 2022-08-13 NOTE — Telephone Encounter (Signed)
Called patient and let her know that her prior Berkley Harvey is approved. Nothing further needed

## 2022-08-13 NOTE — Telephone Encounter (Addendum)
Patient Advocate Encounter  Test claim indicates that the Prior Authorization for Sunosi has been approved.  Determination letter is being sent by mail.  Burnell Blanks, CPhT Rx Patient Advocate Phone: [phone]

## 2022-08-14 ENCOUNTER — Telehealth: Payer: Self-pay | Admitting: Internal Medicine

## 2022-08-14 MED ORDER — SUNOSI 150 MG PO TABS: ORAL_TABLET | ORAL | 5 refills | Status: DC

## 2022-08-14 NOTE — Telephone Encounter (Signed)
[facility] Sun[facility] 150 mg tab sincee insurance rejected 75 mg 2 tabs per day. Script rewritten and e[facility]sent to [facility] in [place]

## 2022-08-15 ENCOUNTER — Encounter: Payer: Self-pay | Admitting: Internal Medicine

## 2022-08-15 NOTE — Assessment & Plan Note (Signed)
She feels she is coping well now with use as directed of Sunosi and naps with attention to safe driving. Plan- refilled Sunosi

## 2022-12-11 ENCOUNTER — Telehealth: Payer: Self-pay

## 2022-12-11 NOTE — Telephone Encounter (Signed)
PA request received via CMM through [employer] for Sunosi 150MG  tablets  PA has been submitted and is awaiting determination.  Key: [id number]

## 2022-12-12 ENCOUNTER — Other Ambulatory Visit (HOSPITAL_COMMUNITY): Payer: Self-pay

## 2022-12-12 NOTE — Telephone Encounter (Signed)
Called and spoke to patient and she is asking for an update on her PA for Sunosi with her new insurance.  Please advise

## 2022-12-12 NOTE — Telephone Encounter (Signed)
PA has been APPROVED for [facility] through [employer] with ID: [id number].  PA is approved through 06/12/2023.

## 2022-12-19 ENCOUNTER — Telehealth: Payer: Self-pay | Admitting: Internal Medicine

## 2022-12-22 NOTE — Telephone Encounter (Signed)
ATC X1 LVM for patient advising of Sunosi approval through [employer]. Will leave this encounter open incase patient has questions.

## 2023-02-17 ENCOUNTER — Other Ambulatory Visit: Payer: Self-pay | Admitting: Internal Medicine

## 2023-02-19 NOTE — Telephone Encounter (Signed)
Sunosi refilled

## 2023-07-02 ENCOUNTER — Telehealth: Payer: Self-pay

## 2023-07-02 NOTE — Telephone Encounter (Signed)
PA has been APPROVED from 07/02/2023-07/01/2024

## 2023-07-02 NOTE — Telephone Encounter (Signed)
*  Pulm  PA request received for Sunosi 150MG  tablets  PA submitted to [employer] via CMM and is pending additional questions/determination  Key: [id number]

## 2023-09-07 ENCOUNTER — Other Ambulatory Visit: Payer: Self-pay | Admitting: Internal Medicine

## 2023-09-09 ENCOUNTER — Telehealth: Payer: Self-pay | Admitting: Internal Medicine

## 2023-09-09 NOTE — Telephone Encounter (Signed)
Patient checking on message for medication. Patient going out of town tomorrow. Putting back as urgent needs this afternoon. Patient phone number is [phone].

## 2023-09-09 NOTE — Telephone Encounter (Signed)
[facility] calling for  status of refill of prescription sunosi.

## 2023-09-10 NOTE — Telephone Encounter (Signed)
Patient requesting refill   LOV:06/30/2022  Dr.Young can you please advise .  Thank you

## 2023-09-10 NOTE — Telephone Encounter (Signed)
Sunosi refilled.

## 2023-09-10 NOTE — Telephone Encounter (Signed)
Rx has been refilled  I called the pt and left her msg to be aware that this was done  Nothing further needed

## 2024-03-15 ENCOUNTER — Other Ambulatory Visit: Payer: Self-pay | Admitting: Internal Medicine

## 2024-03-15 NOTE — Telephone Encounter (Signed)
**Note De-identified  [place] Obfuscation** Please advise 

## 2024-03-15 NOTE — Telephone Encounter (Signed)
Sunosi refilled 

## 2024-06-08 ENCOUNTER — Telehealth: Payer: Self-pay

## 2024-06-08 NOTE — Telephone Encounter (Signed)
*  Pulm  Pharmacy Patient Advocate Encounter   Received notification from [employer] that prior authorization for Sunosi  150MG  tablets  is required/requested.   Insurance verification completed.   The patient is insured through [facility] .   Per test claim: PA required; However, NEW/RECENT labs/notes are needed to complete & submit PA request. Please see below.  CMM Key: [id number]

## 2024-06-08 NOTE — Telephone Encounter (Signed)
 If it is good enough for me just to reorder the Sunosi , I can do that. But if insurance insists on a recent office visit for documentation, then if we can get her in with any of our sleep providers, next available, that is what we need to do.

## 2024-06-10 NOTE — Telephone Encounter (Signed)
 Prior Auth Team:  Do we need to just reorder the Sunosi  or make patient an OV with Dr. Neysa and have labs drawn at that visit?  (Or any of the sleep providers)  Please let me know what I need to do.  Thank you.

## 2024-06-14 NOTE — Telephone Encounter (Signed)
 Left message on VM for patient to call clinic to make OV with Dr. Neysa or any of the sleep providers.  This OV will be to get required updated information and documentation for patient to be able to reorder the Sunosi  medication.  Will route message to schedulers.

## 2024-06-14 NOTE — Telephone Encounter (Signed)
 Updated information and documentation from an office visit is required for submission to the insurance. They require documentation on patient progress while on medication

## 2024-06-15 ENCOUNTER — Telehealth: Payer: Self-pay | Admitting: Internal Medicine

## 2024-06-15 NOTE — Telephone Encounter (Signed)
 Patient has an appointment scheduled for September and is wondering if she can still receive her medication until her appointment. She has not been seen since 2023 so I did advise her that it may not be filled since she has not been seen within a year. Please give patient a call with an update on medication refill.   Medication: Sunosi     [facility] [place]

## 2024-06-15 NOTE — Telephone Encounter (Signed)
 Left PT a VM. No sooner appts. than what she has.

## 2024-06-15 NOTE — Telephone Encounter (Signed)
 Can patient be scheduled with one of the NP providers sooner?  I left a message on patients VM to call back to try to schedule with any of the sleep providers or a NP.

## 2024-07-26 ENCOUNTER — Telehealth: Payer: Self-pay

## 2024-07-26 NOTE — Telephone Encounter (Signed)
 Copied from CRM [id number]. Topic: Clinical - Medication Prior Auth >> Jul 26, 2024 10:35 AM Rilla NOVAK wrote: Reason for CRM:  [facility] to check status of pre-auth.  Please call [phone]. Option hit prescriber.    ----------------------------------------------------------------------- From previous Reason for Contact - Medication Refill: Medication:   Has the patient contacted their pharmacy?   (Agent: If no, request that the patient contact the pharmacy for the refill. If patient does not wish to contact the pharmacy document the reason why and proceed with request.) (Agent: If yes, when and what did the pharmacy advise?)  This is the patient's preferred pharmacy:  [id number] [place], [place] - [address] AT [facility] [address] [place] [address] Phone: [phone] Fax: [phone]  [id number] [place], [place] - [address] [place] [address] [place] [address] Phone: [phone] Fax: [phone]  [phone] [place], [place] - [address] [place] [address] Phone: [phone] Fax: [phone]  [facility] [phone] [place], [place] - [address] [place] [address] Phone: [phone] Fax: [phone]  [id number] [place],  - [address] [facility] [place] [address] Phone: [phone] Fax: [phone]  Is this the correct pharmacy for this prescription?   If no, delete pharmacy and type the correct one.   Has the prescription been filled recently?    Is the patient out of the medication?    Has the patient been seen for an appointment in the last year OR does the patient have an upcoming appointment?    Can we respond through MyChart?    Agent: Please be advised that Rx refills may take up to 3 business days. We ask that you  follow-up with your pharmacy.   ----------------------------------------------------------------------- From previous Reason for Contact - Other: Reason for CRM:

## 2024-07-26 NOTE — Telephone Encounter (Signed)
 I called the number provided- [phone]. There was no answer- the line just rings and no answer.    According to last note regarding med approval, the pt needed ov for documentation purposes to try and get med approved. Her appt is set for 08/16/24.   Will try calling the pharmacy back again later.

## 2024-07-27 NOTE — Telephone Encounter (Signed)
 Called the number provided again and the line just rings. I am going to close encounter per protocol. There is no [facility]S location listed in the note. If the pharm calls back they need to be advised that this will be addressed at her next ov.

## 2024-07-27 NOTE — Telephone Encounter (Signed)
 I called [facility]. Melissa was out of office. I informed pharmacy that we can not fill the RX until pt is seen in office. Pharmacy states they will inform pt and verbalized understanding. NFN

## 2024-07-27 NOTE — Telephone Encounter (Signed)
 Patient is calling back because she takes this medication everyday  sunosi  and she been went out medication for a week. patient has a medication methylphenidate  (CONCERTA ) 36 MG PO CR tablet [[id number]]  that she use to take that she has been taking since she doesn't have the Solriamfetol  HCl (SUNOSI ) 150 MG TABS . Patient is requesting a refill of the methylphenidate  (CONCERTA ) 36 MG PO CR tablet if she is not able to get a refill of the Solriamfetol  HCl (SUNOSI ) 150 MG TABS before her appointment .  [id number] [place] [place] - [address]  [address] [place] [address]  Phone: [phone] Fax: [phone]  Hours: Not open 24 hours  [facility] .  Whoever sent in the medication refill put the wrong number for [facility] . Please give that number a call that's above to speak with pharmacist  Patient is also requesting a call for a update on everything dealing with this situation

## 2024-08-15 NOTE — Progress Notes (Signed)
 [birth year]-23 year old female never smoker followed for narcolepsy with cataplexy NPSG 10/18/17-AHI 0/hour, desaturation to 96%, REM latency 48 minutes, 26.49% REM, total sleep time 443.5 minutes.  Body weight 140 pounds. MSLT 10/19/17-mean sleep latency 2:19 minutes, SOREMS 2 / 5 naps Consistent with Narcolepsy --------------------------------------------------------  06/30/22- Virtual Visit via Video Note  I connected with Savannah Hanna on 06/28/22 at 10:30 AM EDT by a video enabled telemedicine application and verified that I am speaking with the correct person using two identifiers.  Location: Patient: Home Provider: Office   I discussed the limitations of evaluation and management by telemedicine and the availability of in person appointments. The patient expressed understanding and agreed to proceed.  History of Present Illness: 23 year old female never smoker followed for Narcolepsy with cataplexy , complicated by Headache Sunosi  75 mg 1-2 daily. At her request she was given trial Concerta  CR 27 mg, which worked well for a friend. Sunosi  had worked about as well as ritalin . Graduated [employer], applied Dental School, engaged to marry. Saw Endocrine for weight gain, nl TFTs. -  Sunosi  75 mg Covid vax- none Chooses now to stay with Sunosi .  Dealing with cost/coverage issues. Applying to dental school.  We discussed naps, need to discuss medications with OB/GYN if she anticipates pregnancy.   Observations/Objective:   Assessment and Plan:   Follow Up Instructions:    I discussed the assessment and treatment plan with the patient. The patient was provided an opportunity to ask questions and all were answered. The patient agreed with the plan and demonstrated an understanding of the instructions.   The patient was advised to call back or seek an in-person evaluation if the symptoms worsen or if the condition fails to improve as anticipated.  I provided 20 minutes of  non-face-to-face time during this encounter.   Reggy Salt, MD     08/16/24- 22 yoF  never smoker followed for Narcolepsy with cataplexy , complicated by Headache Sunosi  75 mg 1-2 daily. At her request she was given trial Concerta  CR 27 mg, which worked well for a friend. Sunosi  had worked about as well as ritalin . Graduated [employer], applied [employer], engaged to marry. Saw Endocrine for weight gain, nl TFTs. -  Sunosi  150 mg   1 or 2 tabs as needed Discussed the use of AI scribe software for clinical note transcription with the patient, who gave verbal consent to proceed.  History of Present Illness   Savannah Hanna is a 23 year old female with narcolepsy who presents for medication management.  She manages narcolepsy with Sunosi , which effectively maintains wakefulness. She experiences increased headaches when she runs out of the medication. She is currently prescribed Sunosi  with directions to take one or two tablets as needed. Adequate sleep and naps. Safe driving. Dental school. Married     Assessment and Plan:    Narcolepsy Narcolepsy management discussed. Increased headaches off medication indicate efficacy. Discussed potential emotional dependency and advised monitoring for behavioral changes. Sunosi  effective despite cost considerations. - Continue Sunosi , increase prescription to 45 tablets, refillable after 30 days. - Advise monitoring for behavioral changes and emotional dependency. - Discuss with husband if behavioral changes noted. - Schedule follow-up in one year.      ROS-see HPI   + = positive Constitutional:    weight loss, night sweats, fevers, chills, +fatigue, lassitude. HEENT:    +headaches, difficulty swallowing, tooth/dental problems, sore throat,       sneezing, itching, ear ache, nasal congestion, post nasal  drip, snoring CV:    chest pain, orthopnea, PND, swelling in lower extremities, anasarca,  dizziness, palpitations Resp:   shortness  of breath with exertion or at rest.                productive cough,   non-productive cough, coughing up of blood.              change in color of mucus.  wheezing.   Skin:    rash or lesions. GI:  No-   heartburn, indigestion, abdominal pain, nausea, vomiting, diarrhea,                 change in bowel habits, loss of appetite GU: dysuria, change in color of urine, no urgency or frequency.   flank pain. MS:   joint pain, stiffness, decreased range of motion, back pain. Neuro-     nothing unusual Psych:  change in mood or affect.  depression or +anxiety.   memory loss.  OBJ- Physical Exam General- Alert, Oriented, Affect-appropriate, Distress- none acute, +calm and alert, +weight gain Skin- rash-none, lesions- none, excoriation- none Lymphadenopathy- none Head- atraumatic            Eyes- Gross vision intact, PERRLA, conjunctivae and secretions clear            Ears- Hearing, canals-normal            Nose- Clear, no-Septal dev, mucus, polyps, erosion, perforation             Throat- Mallampati II , mucosa clear , drainage- none, tonsils- atrophic Neck- flexible , trachea midline, no stridor , thyroid  nl, carotid no bruit Chest - symmetrical excursion , unlabored           Heart/CV- RRR , no murmur , no gallop  , no rub, nl s1 s2                           - JVD- none , edema- none, stasis changes- none, varices- none           Lung- clear to P&A, wheeze- none, cough+, dullness-none, rub- none           Chest wall-  Abd-  Br/ Gen/ Rectal- Not done, not indicated Extrem- cyanosis- none, clubbing, none, atrophy- none, strength- nl Neuro- grossly intact to observation, no tremor

## 2024-08-16 ENCOUNTER — Ambulatory Visit: Admitting: Internal Medicine

## 2024-08-16 VITALS — BP 118/74 | HR 73 | Ht 64.0 in | Wt 197.0 lb

## 2024-08-16 DIAGNOSIS — G47411 Narcolepsy with cataplexy: Secondary | ICD-10-CM

## 2024-08-16 MED ORDER — SUNOSI 150 MG PO TABS: ORAL_TABLET | ORAL | 5 refills | Status: AC

## 2024-08-16 NOTE — Patient Instructions (Signed)
 Sunosi  refilled ok to try 1.5 or 2 tabs daily if needed

## 2024-08-20 ENCOUNTER — Encounter: Payer: Self-pay | Admitting: Internal Medicine

## 2024-08-30 ENCOUNTER — Telehealth: Payer: Self-pay

## 2024-08-30 NOTE — Telephone Encounter (Signed)
*  [facility]m  Pharmacy Patient Advocate Encounter   Received notification from CoverMyMeds that prior authorization for Sunosi  150mg  is required/requested.   Insurance verification completed.   The patient is insured through [facility] .   Per test claim: PA required; PA submitted to above mentioned insurance via Latent Key/confirmation #/EOC [id number] Status is pending

## 2024-08-30 NOTE — Telephone Encounter (Signed)
 You have met the criteria to receive this medication. SUNOSI  TAB 150MG  has been approved through 08/30/2025. However, per your health plan's criteria, more than 1 tablet per day is covered if you meet the following: For the indication being requested, the higher dose, how often the drug is given, and quantity is supported in one of the following: (A) The drug labeling (dosage and administration section of the manufacturer's prescribing information). (B) One of the following medical resources: (I) [facility] Formulary Service Drug Information. (II) DRUGDEX System by [employer]. (C) Two articles from major peer-reviewed medical journals (clinical research in two articles from major peer reviewed medical journals that present data supporting the higher dose, frequency of administration, and quantity as generally safe and effective).

## 2024-08-31 NOTE — Telephone Encounter (Signed)
 FYI
